# Patient Record
Sex: Female | Born: 1950 | Race: White | Hispanic: No | Marital: Married | Smoking: Former smoker
Health system: Southern US, Community
[De-identification: ages and names within clinical notes are randomized; demographics above are authoritative.]

## PROBLEM LIST (undated history)

## (undated) DIAGNOSIS — H359 Unspecified retinal disorder: Secondary | ICD-10-CM

## (undated) DIAGNOSIS — C4491 Basal cell carcinoma of skin, unspecified: Secondary | ICD-10-CM

## (undated) DIAGNOSIS — D229 Melanocytic nevi, unspecified: Secondary | ICD-10-CM

## (undated) HISTORY — DX: Melanocytic nevi, unspecified: D22.9

## (undated) HISTORY — DX: Basal cell carcinoma of skin, unspecified: C44.91

## (undated) HISTORY — PX: BACK SURGERY: SHX140

## (undated) HISTORY — DX: Unspecified retinal disorder: H35.9

## (undated) HISTORY — PX: KNEE SURGERY: SHX244

---

## 1977-03-28 HISTORY — PX: ABDOMINAL HYSTERECTOMY: SHX81

## 2001-03-28 HISTORY — PX: GALLBLADDER SURGERY: SHX652

## 2001-05-10 ENCOUNTER — Encounter (INDEPENDENT_AMBULATORY_CARE_PROVIDER_SITE_OTHER): Payer: Self-pay | Admitting: Specialist

## 2001-05-10 ENCOUNTER — Encounter: Payer: Self-pay | Admitting: Emergency Medicine

## 2001-05-10 ENCOUNTER — Inpatient Hospital Stay (HOSPITAL_COMMUNITY): Admission: EM | Admit: 2001-05-10 | Discharge: 2001-05-13 | Payer: Self-pay | Admitting: Emergency Medicine

## 2001-05-12 ENCOUNTER — Encounter: Payer: Self-pay | Admitting: Surgery

## 2001-10-12 ENCOUNTER — Ambulatory Visit (HOSPITAL_COMMUNITY): Admission: RE | Admit: 2001-10-12 | Discharge: 2001-10-12 | Payer: Self-pay | Admitting: Gastroenterology

## 2001-10-15 ENCOUNTER — Ambulatory Visit (HOSPITAL_COMMUNITY): Admission: RE | Admit: 2001-10-15 | Discharge: 2001-10-15 | Payer: Self-pay | Admitting: Gastroenterology

## 2001-10-15 ENCOUNTER — Encounter (INDEPENDENT_AMBULATORY_CARE_PROVIDER_SITE_OTHER): Payer: Self-pay | Admitting: *Deleted

## 2008-05-07 ENCOUNTER — Emergency Department (HOSPITAL_BASED_OUTPATIENT_CLINIC_OR_DEPARTMENT_OTHER): Admission: EM | Admit: 2008-05-07 | Discharge: 2008-05-07 | Payer: Self-pay | Admitting: Emergency Medicine

## 2008-11-14 ENCOUNTER — Ambulatory Visit (HOSPITAL_COMMUNITY): Admission: RE | Admit: 2008-11-14 | Discharge: 2008-11-14 | Payer: Self-pay | Admitting: Neurosurgery

## 2009-01-12 ENCOUNTER — Other Ambulatory Visit: Admission: RE | Admit: 2009-01-12 | Discharge: 2009-01-12 | Payer: Self-pay | Admitting: Family Medicine

## 2009-01-20 ENCOUNTER — Encounter: Admission: RE | Admit: 2009-01-20 | Discharge: 2009-01-20 | Payer: Self-pay | Admitting: Neurosurgery

## 2009-02-01 ENCOUNTER — Encounter: Admission: RE | Admit: 2009-02-01 | Discharge: 2009-02-01 | Payer: Self-pay | Admitting: Neurosurgery

## 2009-02-13 DIAGNOSIS — D229 Melanocytic nevi, unspecified: Secondary | ICD-10-CM

## 2009-02-13 HISTORY — DX: Melanocytic nevi, unspecified: D22.9

## 2009-03-24 ENCOUNTER — Encounter: Admission: RE | Admit: 2009-03-24 | Discharge: 2009-03-26 | Payer: Self-pay | Admitting: Orthopaedic Surgery

## 2010-03-28 HISTORY — PX: CATARACT EXTRACTION: SUR2

## 2010-07-03 LAB — CBC
HCT: 43 % (ref 36.0–46.0)
Hemoglobin: 14.7 g/dL (ref 12.0–15.0)
MCHC: 34.3 g/dL (ref 30.0–36.0)
MCV: 95.3 fL (ref 78.0–100.0)
Platelets: 255 10*3/uL (ref 150–400)
RBC: 4.51 MIL/uL (ref 3.87–5.11)
RDW: 13.7 % (ref 11.5–15.5)
WBC: 9.5 10*3/uL (ref 4.0–10.5)

## 2010-08-10 NOTE — Op Note (Signed)
NAMESALEEMAH, [name] NO.:  [id number]   MEDICAL RECORD NO.:  [id number]          PATIENT TYPE:  OIB   LOCATION:  3599                         FACILITY:  [facility]   PHYSICIAN:  Donalee Citrin, M.D.        DATE OF BIRTH:  1951/01/12   DATE OF PROCEDURE:  11/14/2008  DATE OF DISCHARGE:                               OPERATIVE REPORT   PREOPERATIVE DIAGNOSES:  Left L5 radiculopathy from lumbar spondylosis  with stenosis from ruptured disk, L4-5 left.   PROCEDURE:  Lumbar laminectomy microdiskectomy, L4-5 left with  microscopic dissection of the left L5 nerve root microdiskectomy.   SURGEON:  Donalee Citrin, M.D.   ASSISTANT:  Tia Alert, MD.   ANESTHESIA:  General endotracheal.   HISTORY OF PRESENT ILLNESS:  The patient is a very pleasant 60 year old  female who has had progressed worsening __________left hip and leg pain  radiating down to her calf, top of foot and big toe consistent with L5  nerve root pattern.  The patient failed all forms of conservative  treatment.  MRI scan showed large ruptured disk at L4-5 left as well as  spondylosis causing severe stenosis of the left L5 nerve root.  The  risks and benefits of the operation were explained to the patient, she  understands and agreed to proceed forward.   DESCRIPTION OF PROCEDURE:  The patient was brought to the operating  room, induced under general anesthesia, positioned prone on Wilson  frame.  The back was prepped in the usual sterile fashion.  Preop X-ray  localized the appropriate level.  After infiltrating 10 mL lidocaine  with epinephrine, midline incision made.  Bovie electrocautery was used  to take down __________ subperiosteal dissection carried out to lamina  of L4 and L5 on the left.  Intraoperative x-ray confirmed localization  at appropriate level so using a high-speed drill the inferior aspect of  L5, medial facet complex superior aspect of S1 was drilled down.  Then  using 2 and 3 mm Kerrison  punch inferior laminotomy was completed as  well as medial facetectomy and superior laminotomy of L5.  Ligamentum  flavum removed in piecemeal fashion exposing thecal sac and proximal L5  nerve root.  The L5 neural foramen was unroofed.  Then under microscope  illumination the L5 nerve root was dissected off of a large disk  herniation that was inferior to the disk space displacing up against the  pedicle, displacing the L5 nerve root medially.  This was teased away  with a nerve hook after annulotomy was made with the 11 blade scalpel to  open up the disk space and the disk space was radically cleaned out.  Several more fragments were removed from the medial border of the  pedicle.  After all this disk was removed, the L5 nerve root was  significantly decompressed.  The foramen was explored with an angled  hockey stick and coronary dilator and noted to be widely patent.  Wound  was then copiously irrigated.  Hemostasis was maintained.  Gelfoam was  laid overtop the dura. Muscle and fascia  reapproximated in layers with interrupted Vicryl and skin was closed  with running 4-0 subcuticular.  Benzoin and Steri-Strips applied.  The  patient went to recovery room in stable condition.  At the end of the  case needle and sponge count were correct.           ______________________________  Donalee Citrin, M.D.     GC/MEDQ  D:  11/14/2008  T:  11/14/2008  Job:  [id number]

## 2010-08-13 NOTE — Consult Note (Signed)
[facility]  Patient:    Melinda [name], KETRON Visit Number: [id number] MRN: [id number]          Service Type: MED Location: 3W 0345 02 Attending Physician:  Cathren Laine Dictated by:   Velora Heckler, M.D. Proc. Date: 05/10/01 Admit Date:  05/10/2001   CC:         Anselmo Rod, M.D.   Consultation Report  REASON FOR CONSULTATION:  Biliary pancreatitis, cholelithiasis, chronic cholecystitis.  ADMITTING PHYSICIAN:  Anselmo Rod, M.D.  BRIEF HISTORY:  The patient is a 60 year old white female admitted to Dr. Trilby Drummer service from the emergency department with 10-hour history of upper abdominal pain. The patient was in her normal state of good health at the grocery store with her daughter when she had a sudden onset of abdominal pain. The patient took Tagamet without symptomatic relief. This persisted until approximately 4:00 a.m. on the morning of admission, when the patient requested that her family take her to the emergency department for evaluation. The patient has had intermittent symptoms lasting from 1-2 hours after meals for approximately 6 months. She denies any history of fever. She denies any history of jaundice or acholic stools. The patient has had no prior hepatobiliary disease. The patient was taken to the emergency department at [facility] where she was evaluated. She was noted to have elevated liver function tests and elevated serum amylase levels. Ultrasound of the abdomen was obtained which showed multiple gallstones, increased wall thickness of the gallbladder, and no evidence of pericholecystic fluid, findings consistent with chronic cholecystitis. Both gastroenterology and general surgery are consulted at this time.  PAST MEDICAL HISTORY:  Status post abdominal hysterectomy, status post bilateral salpingo-oophorectomy, status post ganglion cyst of the foot, status post pilonidal cyst excision by Dr. Francina Ames,  60 years ago.  MEDICATIONS:  None.  ALLERGIES:  No known drug allergies.  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 She smokes 1/2 pack of cigarettes a day. She drinks alcohol on occasion. She is married.  FAMILY HISTORY:  No previous adverse events with anesthesia or surgical procedures. History of coronary artery disease in the patients father, history of diabetes.  REVIEW OF SYSTEMS:  A 15-system review without significant other positives except as noted above.  PHYSICAL EXAMINATION:  GENERAL:  This is a 60 year old well-developed, well-nourished white female on 3rd floor ward, [facility].  VITAL SIGNS:  Temperature 97.4, pulse 87, respirations 18, blood pressure 136/85.  HEENT:  Normocephalic, atraumatic. Sclerae are clear. Dentition is good.  NECK:  Supple without mass. Thyroid is normal without nodularity.  LUNGS:  Clear to auscultation without rales or rhonchi. There is no costovertebral angle tenderness.  CARDIAC:  Shows regular rate and rhythm without murmur.  ABDOMEN:  Soft. There are bowel sounds present. There is tenderness in the epigastrium and particularly in the right upper quadrant to deep palpation. There are no palpable masses. There is no guarding. There is no rebound tenderness. There is a well-healed Pfannenstiel incision and a well-healed umbilical incision consistent with previous gynecologic surgery.  EXTREMITIES:  Nontender without edema.  NEUROLOGIC:  The patient is alert and oriented to person, place, and time without focal neurologic deficit.  LABORATORY AND ACCESSORY DATA:  Dated May 10, 2001:  White count 12.2, hemoglobin 15.0, hematocrit 42.3%, platelet count 261,000, differential shows 84% neutrophils, 10% lymphocytes, 6% monocytes. Chemistry profile shows the following abnormalities:  Glucose 113, SGOT 630, SGPT 418. Alkaline phosphatase  is normal at 95, total bilirubin is normal at  1.1, serum amylase is elevated at 1,428.  Ultrasound revealed abdominal ultrasound dated May 10, 2001, from [facility] shows findings of multiple gallstones. The gallbladder wall was thickened. There was no pericholecystic fluid or wall edema. Common bile duct was normal at 3.2 mm. Final diagnosis:  Cholelithiasis with likely chronic cholecystitis.  EKG shows normal sinus rhythm, no acute findings.  IMPRESSION: 1. Biliary pancreatitis. 2. Cholelithiasis. 3. Chronic cholecystitis.  PLAN: 1. Agree with admission and nothing per mouth status, intravenous    hydration. 2. Repeat laboratory studies in the morning, including CBC, complete    metabolic profile, and amylase level. 3. If no significant improvement, consider endoscopic retrograde    cholangiopancreatography with stone extraction. 4. The patient will need cholecystectomy during this admission. Dictated by:   Velora Heckler, M.D. Attending Physician:  Cathren Laine DD:  05/10/01 TD:  05/10/01 Job: [id number]

## 2010-08-13 NOTE — Discharge Summary (Signed)
[facility]  Patient:    AMARII, AMY Visit Number: [id number] MRN: [id number]          Service Type: MED Location: 3W 0345 02 Attending Physician:  Bonnetta Barry Dictated by:   Angelia Mould. Derrell Lolling, M.D. Admit Date:  05/10/2001 Discharge Date: 05/13/2001   CC:         Anselmo Rod, M.D.   Discharge Summary  FINAL DIAGNOSES: 1. Acute biliary pancreatitis. 2. Chronic cholecystitis with cholelithiasis.  OPERATIONS PERFORMED:  Laparoscopic cholecystectomy with intraoperative cholangiogram May 12, 2001.  HISTORY OF PRESENT ILLNESS:  This is a 60 year old white female who presented with a 12-hour history of right upper quadrant abdominal pain and nausea and one episode of emesis.  She has had similar bouts of pain although less severe for about six months, usually after eating.  She denies fever, chills.  For details of her past medical history, family history, and social history, please see detailed admission note.  PHYSICAL EXAMINATION:  GENERAL:  A healthy-appearing middle-aged white female in mild distress from pain.  HEENT:  Sclerae clear.  NECK:  Without nodes.  LUNGS:  Clear.  HEART:  Regular rate and rhythm.  No murmur.  ABDOMEN:  Soft but with right upper quadrant tenderness.  No rebound. Positive Murphys sign.  No mass.  ADMISSION DATA:  White blood cell count 12,200, hemoglobin 15, total bilirubin 1.1, alkaline phosphatase 418, AST 630, amylase 1428, lipase less than 5.  HOSPITAL COURSE:  The patient was admitted by Dr. Danelle Earthly and treated for acute pancreatitis with analgesics, IV hydration, and bowel rest.  The patient was seen on May 10, 2001, by Dr. Darnell Level of the [facility] practice.  He felt that she was having biliary pancreatitis and recommended initial nonoperative treatment for a day or two.  Over the next 48 hours, the patients pain got much better, white blood  cell count came down to 6600, and liver function tests improved.  We discussed options for management and decided against ERCP feeling that that would be a low-yield procedure.  Surgery was recommended.  Surgical management was transferred to me and that was agreeable to the patient.  The patient was taken to the operating room on May 12, 2001, and underwent laparoscopic cholecystectomy with intraoperative cholangiogram.  The surgery went well. The following morning, she looked much better.  Her liver function tests although not normal were almost completely normal.  She was able to tolerate a diet, ambulate independently, and was ready to go home.  She was discharged on May 13, 2001.  She was given a prescription for Vicodin for pain.  She was asked to return to see me in the office in three weeks. Dictated by:   Angelia Mould. Derrell Lolling, M.D. Attending Physician:  Bonnetta Barry DD:  06/04/01 TD:  06/06/01 Job: [id number]

## 2010-08-13 NOTE — Op Note (Signed)
[facility]  Patient:    Melinda [name], Melinda [name] Visit Number: [id number] MRN: [id number]          Service Type: MED Location: 3W 0345 02 Attending Physician:  Cathren Laine Dictated by:   Angelia Mould. Derrell Lolling, M.D. Proc. Date: 05/12/01 Admit Date:  05/10/2001   CC:         Anselmo Rod, M.D.   Operative Report  PREOPERATIVE DIAGNOSIS:  Gallstone pancreatitis.  POSTOPERATIVE DIAGNOSIS:  Gallstone pancreatitis.  OPERATION PERFORMED:  Laparoscopic cholecystectomy with intraoperative cholangiogram.  SURGEON:  Angelia Mould. Derrell Lolling, M.D.  FIRST ASSISTANT:  Anselm Pancoast. Zachery Dakins, M.D.  INDICATIONS FOR PROCEDURE:  This is a 60 year old white female previously well. She was admitted on May 10, 2001 with a 12 hour history of right upper quadrant pain and nausea. She was found to have some abdominal tenderness in the right upper quadrant. Lab work showed a serum amylase of 1428, alkaline phosphatase 95, AST 630, ALT 418, bilirubin 1.1. Ultrasound showed multiple gallstones, thickened gallbladder wall, and a normal common bile duct. Clinically, her pancreatitis has resolved with normalization of her amylase, resolution of her pain and improvement but not complete normalization of her liver function tests. She is brought to the operating room semi-electively.  OPERATIVE FINDINGS:  The gallbladder was chronically inflamed and thick walled. There was no ascites, no fat necrosis, no obvious purulence. The cystic duct was tiny in caliber. The cholangiogram showed normal intrahepatic and extrahepatic bile ducts, no filling defect, but a very slow emptying into the duodenum consistent with pancreatic edema. Small bowel and large bowel, liver, stomach, and peritoneal surfaces were all otherwise normal to inspection.  OPERATIVE TECHNIQUE:  Following the induction of general anesthesia, the patients abdomen was prepped and draped in a sterile fashion. The 0.5% Marcaine  with epinephrine was used a local infiltration anesthetic. A vertically oriented incision was made inside the lower rim of the umbilicus. The fascia was incised in the midline and the abdominal cavity entered under direct vision. The 10 mm Hasson trocar was inserted and secured with a pursestring suture of #0 Vicryl. A pneumoperitoneum was created. The video camera was inserted with visualization and findings as described above. A 10 mm trocar was placed in the subxiphoid region and two 5 mm trocars placed in the right mid abdomen. The gallbladder was elevated. Adhesions were taken down. We dissected the peritoneum off of the cystic duct and cystic artery. We isolated the cystic artery, anterior branches around the gallbladder, secured it with metal clips and divided it. We then later found the posterior branch and it went onto the posterior wall of the gallbladder, isolated it with metal clips and divided it. We isolated a very large length of the cystic duct and created a nice window behind it. A metal clip was placed on the cystic duct close to the gallbladder. The cholangiogram catheter was inserted into the cystic duct and the cholangiogram was obtained using the C-arm.  This showed normal intrahepatic and extrahepatic bile ducts, no filling defects, but very slow drainage into the duodenum. We gave a milligram of Glucagon and waited about 5 minutes and the drainage was still kind of slow but with magnification views, we could see that there was no filling defect just a little bit of tinkering consistent with some pancreatic edema. We had this reviewed by a radiologist and he agreed with this interpretation. We felt that nothing further needed to be done. The cholangiogram catheter was removed. The cystic duct was  secured with metal clips and divided. The gallbladder was dissected from its bed with electrocautery and removed through the umbilical port. The operative field was copiously  irrigated. At the completion of the case, there was no bleeding and no bile leak whatsoever. The trocars were removed under direct vision and there was no bleeding from the trocar sites. The pneumoperitoneum was released. The fascia at the umbilicus was closed with #0 Vicryl sutures. The skin incisions were closed with subcuticular sutures of 4-0 Vicryl and Steri-Strips. Clean bandages were placed. The patient taken to the recovery room in stable condition. Estimated blood loss was about 10 cc. Complications none. Sponge, needle and instrument counts were correct. Dictated by:   Angelia Mould. Derrell Lolling, M.D. Attending Physician:  Cathren Laine DD:  05/12/01 TD:  05/12/01 Job: [id number]

## 2010-08-13 NOTE — Procedures (Signed)
[place]  Patient:    Melinda [name], Melinda [name] Visit Number: [id number] MRN: [id number]          Service Type: END Location: ENDO Attending Physician:  Charna Elizabeth Dictated by:   Anselmo Rod, M.D. Proc. Date: 10/15/01 Admit Date:  10/15/2001 Discharge Date: 10/15/2001   CC:         Marinus Maw, M.D.   Procedure Report  DATE OF BIRTH:  07-Aug-1950  PROCEDURE PERFORMED:  Colonoscopy with snare polypectomy x1.  ENDOSCOPIST:  Anselmo Rod, M.D.  INSTRUMENT USED:  Olympus video colonoscope.  INDICATIONS FOR PROCEDURE:  A 60 year old white female undergoing screening colonoscopy.  The patient has a history of abdominal pain and a personal history of cervical cancer.  She has a longstanding history of constipation.  PREPROCEDURE PREPARATION:  Informed consent was procured from the patient. The patient was fasted for eight hours prior to the procedure, and prepped with two bottles of Fleets Phospho-Soda the night prior to the procedure, and maintained on clear liquids for 48 hours prior to the procedure.  The patient was prepped and brought to the endoscopy unit for a colonoscopy on October 12, 2001, but as there was a large amount of residual stool in the colon, she was sent home with plans for repeat prep and repeat colonoscopy.  PREPROCEDURE PHYSICAL:  VITAL SIGNS:  Stable.  NECK:  Supple.  CHEST:  Clear to auscultation, S1 and S2 regular.  ABDOMEN:  Soft with normal bowel sounds.  DESCRIPTION OF PROCEDURE:  The patient was placed in the left lateral decubitus position and sedated with 50 mg of Demerol and 4 mg of Versed intravenously.  Once the patient was adequately sedated, maintained on low flow oxygen and continuous cardiac monitoring, the Olympus video colonoscope was advanced from the rectum to the cecum with extreme difficulty.  The patients position was changed from the left lateral to the supine right lateral  position.  She was then changed to a prone position to facilitate the entry of the scope into the cecum.  There was a large amount of residual stool in the right colon.  Multiple washings were done.  The appendicular orifice and the ileocecal valve were clearly visualized and photographed.  A flat polyp was snared from the rectum.  The colon seemed very atonic.  IMPRESSION: 1. Small flat polyp snared from the rectum. 2. Atonic colon. 3. No large masses or polyps seen besides the one mentioned above.  RECOMMENDATIONS: 1. Await pathology results. 2. Avoid all nonsteroidals for the next three weeks. 3. Outpatient follow up in the next two weeks. Dictated by:   Anselmo Rod, M.D. Attending Physician:  Charna Elizabeth DD:  10/15/01 TD:  10/18/01 Job: [id number]

## 2010-08-13 NOTE — H&P (Signed)
[facility]  Patient:    Melinda [name], Melinda [name] Visit Number: [id number] MRN: [id number]          Service Type: MED Location: 3W 0345 02 Attending Physician:  Cathren Laine Dictated by:   Myles Rosenthal, M.D. Admit Date:  05/10/2001   CC:         Velora Heckler, M.D.   History and Physical  CHIEF COMPLAINT:  Abdominal pain and nausea.  HISTORY OF PRESENT ILLNESS:  This is a 60 year old white female without significant past medical history, who presents with 12 hours of severe right upper quadrant abdominal pain and nausea.  She had onset of pain at approximately 8:30 p.m. last evening while shopping.  The pain was sharp, located in the epigastrium, and radiated to the right upper quadrant.  She did have nausea associated with this, as well as one episode of brown emesis later in the evening.  She denies any coffee grounds or frank blood in her emesis. The pain was unrelieved with antacids.  She does report a history of previous episodes of similar pain, approximately one to two per month, over the past six months which resolved within one to two hours.  She denies any diarrhea, constipation, melena, or hematochezia.  She denies any changes in her bowel habits.  No changes in stool caliber.  No odynophagia or dysphagia.  She denies any fever, chills, shortness of breath, chest pain, weakness, dizziness, dysuria, polyuria, rash, or extremity swelling.  PAST MEDICAL HISTORY:  No chronic medical problems noted.  PAST SURGICAL HISTORY: 1. Status post hysterectomy in 1979 secondary to cervical cancer in situ, as    well as fibroids. 2. Status post bilateral oophorectomy in 1982. 3. Status post right foot surgery for ganglion cyst removal in 1981. 4. Status post remote pilonidal cyst removal.  ALLERGIES:  NKDA.  MEDICINES:  None.  FAMILY HISTORY:  Mother deceased with CVA and coronary artery disease with an MI at the age of 56.  Father living with metastatic  prostate cancer and type 2 diabetes, diet controlled.  She does have two brothers, both of which are deceased with cancer, one with melanoma and the other with lymphoma.  There is no history of colon cancer or any bowel disease.  SOCIAL HISTORY:  She is married.  She does have three grown children, none of which live with her.  She works as a Production designer, theatre/television/film for a [employer], environmental.  She smokes half of a pack of cigarettes daily.  She does occasionally drink alcohol, approximately two to three drinks per month.  She does deny any illicit drug use.  No history of intravenous drug use.  PHYSICAL EXAMINATION:  VITAL SIGNS:  Temperature 97.4 degrees, pulse 87, respirations 18, blood pressure 136/85.  GENERAL APPEARANCE:  This is a well-developed, well-nourished, white female who looks her stated age.  Sitting up in bed in no apparent distress.  HEENT:  Normocephalic and atraumatic.  Pupils equal, round, and reactive to light.  Extraocular muscles are intact.  The oropharynx is clear with moist mucous membranes.  NECK:  There is no lymphadenopathy or thyromegaly.  CHEST:  Clear to auscultation bilaterally without any wheezes.  She does have good air movement throughout.  CARDIOVASCULAR:  Regular rate and rhythm without murmurs, rubs, or gallops. She has 2+ pulses in all four extremities.  ABDOMEN:  Notable for positive right upper quadrant tenderness without rebound or guarding.  She does have a positive Murphys sign.  There is no mass or hepatosplenomegaly  noted.  She does have normoactive bowel sounds.  EXTREMITIES:  No edema or rash.  NEUROLOGIC:  She alert and oriented x 4.  Cranial nerves are intact and equal bilaterally.  Sensation and strength are grossly within normal limits in all four extremities.  LABORATORY DATA:  Sodium 139, potassium 4.5, chloride 105, CO2 28, BUN 11, creatinine 0.8, glucose 113, AST 630, ALT 418, alkaline phosphatase 95, bilirubin 1.1.  The protein is 7.2 and  albumin 3.7.  The amylase is 1428 and the lipase is less than 5.  The white count is 12.2 with 84% polys, hemoglobin 15.0, and platelets 261.  The EKG shows normal sinus rhythm with a sinus arrhythmia.  There were no ischemic changes noted.  The abdominal ultrasound reveals multiple gallstones and a thickened gallbladder wall.  The common bile duct appears normal with a diameter of 3.2 mm.  ASSESSMENT AND PLAN:  Cholecystitis.  Will admit the patient and hydrate with intravenous fluids while controlling her symptoms with IV Demerol and Phenergan as needed.  We will keep her NPO for now until surgery has evaluated her for a possible cholecystectomy.  Velora Heckler, M.D., is aware of the patient and will see later today.  As the patients lipase is low normal, I doubt that she does have gallstone pancreatitis.  I suspect that her increased amylase, as well as her elevated LFTs are likely secondary to her gallbladder disease.  Will check PT and PTT as part of preoperative evaluation.  Will plan repeat LFTs and CBC tomorrow morning. Dictated by:   Myles Rosenthal, M.D. Attending Physician:  Cathren Laine DD:  05/10/01 TD:  05/10/01 Job: [id number]

## 2010-08-13 NOTE — Procedures (Signed)
[facility]  Patient:    Melinda [name], Melinda [name] Visit Number: [id number] MRN: [id number]          Service Type: END Location: ENDO Attending Physician:  Charna Elizabeth Dictated by:   Anselmo Rod, M.D. Proc. Date: 10/12/01 Admit Date:  10/15/2001 Discharge Date: 10/15/2001   CC:         Marinus Maw, M.D.   Procedure Report  DATE OF BIRTH:  1951-03-17  PROCEDURE PERFORMED:  Colonoscopy changed to a flexible sigmoidoscopy.  ENDOSCOPIST:  Anselmo Rod, M.D.  INSTRUMENT USED:  Olympus video colonoscope.  INDICATIONS FOR PROCEDURE:  A 60 year old white female undergoing screening colonoscopy.  The patient has a history of breast cancer in a cousin who died of it and two brothers who died of metastatic melanoma.  The patient has a personal history of cervical cancer.  The patient denies hematochezia or melena.  DESCRIPTION OF PROCEDURE:  The patient was placed in the left lateral decubitus position and sedated with 50 mg of Demerol and 6 mg of Versed intravenously.  Once the patient was adequately sedated, maintained on low flow oxygen and continuous cardiac monitoring, the Olympus video colonoscope was advanced from the rectum to about 40 cm with extreme difficulty.  There was a large amount of residual stool in the colon.  The procedure had to be aborted at this point with plan to reprep the patient and rescope the patient next week. Dictated by:   Anselmo Rod, M.D. Attending Physician:  Charna Elizabeth DD:  10/12/01 TD:  10/17/01 Job: [id number]

## 2010-08-24 ENCOUNTER — Other Ambulatory Visit: Payer: Self-pay

## 2010-08-24 DIAGNOSIS — C4491 Basal cell carcinoma of skin, unspecified: Secondary | ICD-10-CM

## 2010-08-24 HISTORY — DX: Basal cell carcinoma of skin, unspecified: C44.91

## 2010-12-13 ENCOUNTER — Other Ambulatory Visit: Payer: Self-pay | Admitting: Dermatology

## 2010-12-13 DIAGNOSIS — C4491 Basal cell carcinoma of skin, unspecified: Secondary | ICD-10-CM

## 2010-12-13 HISTORY — DX: Basal cell carcinoma of skin, unspecified: C44.91

## 2011-09-13 ENCOUNTER — Other Ambulatory Visit: Payer: Self-pay | Admitting: Dermatology

## 2011-11-10 ENCOUNTER — Other Ambulatory Visit: Payer: Self-pay | Admitting: Dermatology

## 2012-09-13 ENCOUNTER — Other Ambulatory Visit: Payer: Self-pay | Admitting: Optometry

## 2012-09-13 DIAGNOSIS — H547 Unspecified visual loss: Secondary | ICD-10-CM

## 2012-09-20 ENCOUNTER — Ambulatory Visit
Admission: RE | Admit: 2012-09-20 | Discharge: 2012-09-20 | Disposition: A | Discharge status: Home / Self Care | Payer: 59 | Source: Ambulatory Visit | Attending: Optometry | Admitting: Optometry

## 2012-09-20 DIAGNOSIS — H547 Unspecified visual loss: Secondary | ICD-10-CM

## 2012-09-20 MED ORDER — GADOBENATE DIMEGLUMINE 529 MG/ML IV SOLN
18.0000 mL | Freq: Once | INTRAVENOUS | Status: AC | PRN
  Administered 2012-09-20: 18 mL via INTRAVENOUS

## 2014-08-21 ENCOUNTER — Other Ambulatory Visit: Payer: Self-pay | Admitting: Physician Assistant

## 2014-12-24 ENCOUNTER — Other Ambulatory Visit (HOSPITAL_COMMUNITY): Payer: Self-pay | Admitting: Orthopaedic Surgery

## 2014-12-24 DIAGNOSIS — M79671 Pain in right foot: Secondary | ICD-10-CM

## 2015-01-01 ENCOUNTER — Ambulatory Visit (HOSPITAL_COMMUNITY): Admission: RE | Admit: 2015-01-01 | Payer: 59 | Source: Ambulatory Visit

## 2015-01-01 ENCOUNTER — Ambulatory Visit (HOSPITAL_COMMUNITY)
Admission: RE | Admit: 2015-01-01 | Discharge: 2015-01-01 | Disposition: A | Discharge status: Home / Self Care | Payer: 59 | Source: Ambulatory Visit | Attending: Orthopaedic Surgery | Admitting: Orthopaedic Surgery

## 2015-01-01 DIAGNOSIS — M2011 Hallux valgus (acquired), right foot: Secondary | ICD-10-CM | POA: Diagnosis not present

## 2015-01-01 DIAGNOSIS — M79671 Pain in right foot: Secondary | ICD-10-CM

## 2015-09-15 ENCOUNTER — Other Ambulatory Visit: Payer: Self-pay | Admitting: Orthopaedic Surgery

## 2015-09-15 DIAGNOSIS — M25472 Effusion, left ankle: Secondary | ICD-10-CM

## 2015-09-16 ENCOUNTER — Ambulatory Visit
Admission: RE | Admit: 2015-09-16 | Discharge: 2015-09-16 | Disposition: A | Discharge status: Home / Self Care | Payer: 59 | Source: Ambulatory Visit | Attending: Orthopaedic Surgery | Admitting: Orthopaedic Surgery

## 2015-09-16 DIAGNOSIS — M25472 Effusion, left ankle: Secondary | ICD-10-CM

## 2015-10-27 DIAGNOSIS — R928 Other abnormal and inconclusive findings on diagnostic imaging of breast: Secondary | ICD-10-CM | POA: Diagnosis not present

## 2015-10-27 DIAGNOSIS — E785 Hyperlipidemia, unspecified: Secondary | ICD-10-CM | POA: Diagnosis not present

## 2015-10-27 DIAGNOSIS — E559 Vitamin D deficiency, unspecified: Secondary | ICD-10-CM | POA: Diagnosis not present

## 2015-11-11 DIAGNOSIS — H35071 Retinal telangiectasis, right eye: Secondary | ICD-10-CM | POA: Diagnosis not present

## 2015-11-11 DIAGNOSIS — H469 Unspecified optic neuritis: Secondary | ICD-10-CM | POA: Diagnosis not present

## 2015-11-11 DIAGNOSIS — H35351 Cystoid macular degeneration, right eye: Secondary | ICD-10-CM | POA: Diagnosis not present

## 2015-11-11 DIAGNOSIS — H35341 Macular cyst, hole, or pseudohole, right eye: Secondary | ICD-10-CM | POA: Diagnosis not present

## 2015-11-11 DIAGNOSIS — H5359 Other color vision deficiencies: Secondary | ICD-10-CM | POA: Diagnosis not present

## 2015-11-11 DIAGNOSIS — H547 Unspecified visual loss: Secondary | ICD-10-CM | POA: Diagnosis not present

## 2016-01-21 DIAGNOSIS — Z0000 Encounter for general adult medical examination without abnormal findings: Secondary | ICD-10-CM | POA: Diagnosis not present

## 2016-01-21 DIAGNOSIS — E785 Hyperlipidemia, unspecified: Secondary | ICD-10-CM | POA: Diagnosis not present

## 2016-01-21 DIAGNOSIS — E559 Vitamin D deficiency, unspecified: Secondary | ICD-10-CM | POA: Diagnosis not present

## 2016-01-21 DIAGNOSIS — Z23 Encounter for immunization: Secondary | ICD-10-CM | POA: Diagnosis not present

## 2016-02-05 DIAGNOSIS — I8312 Varicose veins of left lower extremity with inflammation: Secondary | ICD-10-CM | POA: Diagnosis not present

## 2016-02-05 DIAGNOSIS — I83892 Varicose veins of left lower extremities with other complications: Secondary | ICD-10-CM | POA: Diagnosis not present

## 2016-02-09 DIAGNOSIS — L570 Actinic keratosis: Secondary | ICD-10-CM | POA: Diagnosis not present

## 2016-02-09 DIAGNOSIS — D239 Other benign neoplasm of skin, unspecified: Secondary | ICD-10-CM | POA: Diagnosis not present

## 2016-02-09 DIAGNOSIS — I83812 Varicose veins of left lower extremities with pain: Secondary | ICD-10-CM | POA: Diagnosis not present

## 2016-02-09 DIAGNOSIS — I8312 Varicose veins of left lower extremity with inflammation: Secondary | ICD-10-CM | POA: Diagnosis not present

## 2016-02-11 DIAGNOSIS — H3581 Retinal edema: Secondary | ICD-10-CM | POA: Diagnosis not present

## 2016-02-11 DIAGNOSIS — D8989 Other specified disorders involving the immune mechanism, not elsewhere classified: Secondary | ICD-10-CM | POA: Diagnosis not present

## 2016-02-11 DIAGNOSIS — H547 Unspecified visual loss: Secondary | ICD-10-CM | POA: Diagnosis not present

## 2016-02-11 DIAGNOSIS — H468 Other optic neuritis: Secondary | ICD-10-CM | POA: Diagnosis not present

## 2016-02-11 DIAGNOSIS — H469 Unspecified optic neuritis: Secondary | ICD-10-CM | POA: Diagnosis not present

## 2016-02-11 DIAGNOSIS — H5359 Other color vision deficiencies: Secondary | ICD-10-CM | POA: Diagnosis not present

## 2016-02-11 DIAGNOSIS — Z135 Encounter for screening for eye and ear disorders: Secondary | ICD-10-CM | POA: Diagnosis not present

## 2016-02-11 DIAGNOSIS — H3500 Unspecified background retinopathy: Secondary | ICD-10-CM | POA: Diagnosis not present

## 2016-02-11 DIAGNOSIS — H35353 Cystoid macular degeneration, bilateral: Secondary | ICD-10-CM | POA: Diagnosis not present

## 2016-03-02 DIAGNOSIS — I83812 Varicose veins of left lower extremities with pain: Secondary | ICD-10-CM | POA: Diagnosis not present

## 2016-03-02 DIAGNOSIS — I8312 Varicose veins of left lower extremity with inflammation: Secondary | ICD-10-CM | POA: Diagnosis not present

## 2016-04-06 DIAGNOSIS — Z01818 Encounter for other preprocedural examination: Secondary | ICD-10-CM | POA: Diagnosis not present

## 2016-04-06 DIAGNOSIS — Z1211 Encounter for screening for malignant neoplasm of colon: Secondary | ICD-10-CM | POA: Diagnosis not present

## 2016-04-07 DIAGNOSIS — I83892 Varicose veins of left lower extremities with other complications: Secondary | ICD-10-CM | POA: Diagnosis not present

## 2016-04-07 DIAGNOSIS — I8312 Varicose veins of left lower extremity with inflammation: Secondary | ICD-10-CM | POA: Diagnosis not present

## 2016-04-28 DIAGNOSIS — I8312 Varicose veins of left lower extremity with inflammation: Secondary | ICD-10-CM | POA: Diagnosis not present

## 2016-04-28 DIAGNOSIS — I83812 Varicose veins of left lower extremities with pain: Secondary | ICD-10-CM | POA: Diagnosis not present

## 2016-06-09 DIAGNOSIS — H547 Unspecified visual loss: Secondary | ICD-10-CM | POA: Diagnosis not present

## 2016-06-09 DIAGNOSIS — H5359 Other color vision deficiencies: Secondary | ICD-10-CM | POA: Diagnosis not present

## 2016-06-09 DIAGNOSIS — H3500 Unspecified background retinopathy: Secondary | ICD-10-CM | POA: Diagnosis not present

## 2016-06-09 DIAGNOSIS — D8989 Other specified disorders involving the immune mechanism, not elsewhere classified: Secondary | ICD-10-CM | POA: Diagnosis not present

## 2016-06-09 DIAGNOSIS — H469 Unspecified optic neuritis: Secondary | ICD-10-CM | POA: Diagnosis not present

## 2016-06-09 DIAGNOSIS — H35351 Cystoid macular degeneration, right eye: Secondary | ICD-10-CM | POA: Diagnosis not present

## 2016-06-09 DIAGNOSIS — Z135 Encounter for screening for eye and ear disorders: Secondary | ICD-10-CM | POA: Diagnosis not present

## 2016-08-08 DIAGNOSIS — M8588 Other specified disorders of bone density and structure, other site: Secondary | ICD-10-CM | POA: Diagnosis not present

## 2016-09-20 DIAGNOSIS — D8989 Other specified disorders involving the immune mechanism, not elsewhere classified: Secondary | ICD-10-CM | POA: Diagnosis not present

## 2016-09-20 DIAGNOSIS — H547 Unspecified visual loss: Secondary | ICD-10-CM | POA: Diagnosis not present

## 2016-09-20 DIAGNOSIS — H469 Unspecified optic neuritis: Secondary | ICD-10-CM | POA: Diagnosis not present

## 2016-09-20 DIAGNOSIS — H3500 Unspecified background retinopathy: Secondary | ICD-10-CM | POA: Diagnosis not present

## 2016-09-20 DIAGNOSIS — H35351 Cystoid macular degeneration, right eye: Secondary | ICD-10-CM | POA: Diagnosis not present

## 2016-09-21 ENCOUNTER — Other Ambulatory Visit: Payer: Self-pay | Admitting: Ophthalmology

## 2016-09-21 DIAGNOSIS — H3500 Unspecified background retinopathy: Secondary | ICD-10-CM

## 2016-09-21 DIAGNOSIS — D8989 Other specified disorders involving the immune mechanism, not elsewhere classified: Secondary | ICD-10-CM

## 2016-09-27 ENCOUNTER — Other Ambulatory Visit: Payer: 59

## 2016-09-29 ENCOUNTER — Ambulatory Visit
Admission: RE | Admit: 2016-09-29 | Discharge: 2016-09-29 | Disposition: A | Discharge status: Home / Self Care | Payer: 59 | Source: Ambulatory Visit | Attending: Ophthalmology | Admitting: Ophthalmology

## 2016-09-29 DIAGNOSIS — H3500 Unspecified background retinopathy: Secondary | ICD-10-CM

## 2016-09-29 DIAGNOSIS — R195 Other fecal abnormalities: Secondary | ICD-10-CM | POA: Diagnosis not present

## 2016-09-29 DIAGNOSIS — I700 Atherosclerosis of aorta: Secondary | ICD-10-CM | POA: Diagnosis not present

## 2016-09-29 DIAGNOSIS — D8989 Other specified disorders involving the immune mechanism, not elsewhere classified: Secondary | ICD-10-CM

## 2016-09-29 MED ORDER — IOPAMIDOL (ISOVUE-300) INJECTION 61%
100.0000 mL | Freq: Once | INTRAVENOUS | Status: AC | PRN
  Administered 2016-09-29: 100 mL via INTRAVENOUS

## 2016-11-05 IMAGING — MR MR FOOT*R* W/O CM
4 of 6 series · 19 of 40 positions shown · non-contrast
Comparison: None.

CLINICAL DATA: Pain at the first metatarsal phalangeal joint for 1
year. Osteoarthritis with bunion. Initial encounter.

EXAM:
MRI OF THE RIGHT FOREFOOT WITHOUT CONTRAST
TECHNIQUE: Multiplanar, multisequence MR imaging was performed. No intravenous
contrast was administered.

[Series 3: T1 · coronal · 4.0mm · 0.29mm/px · 9 of 32 slices shown (1 of 3)]
[im 1/32]
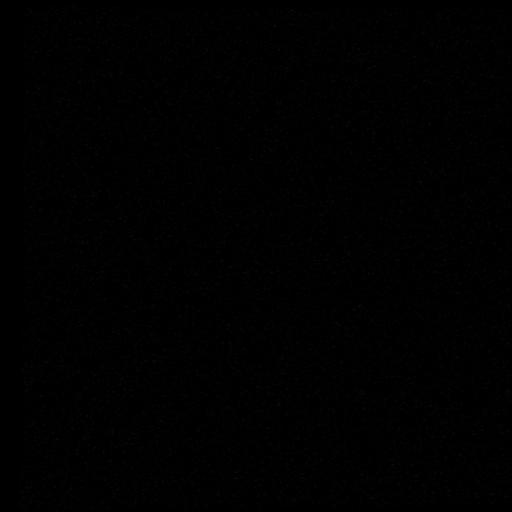
[im 4/32]
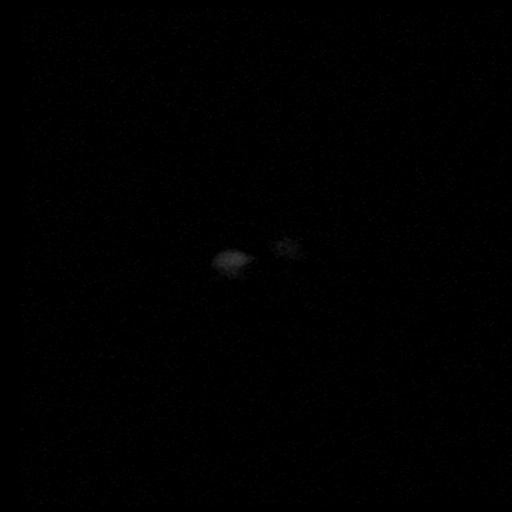
[im 8/32]
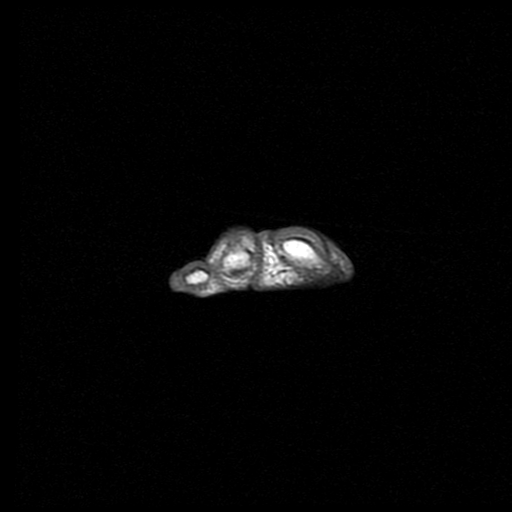
[im 12/32]
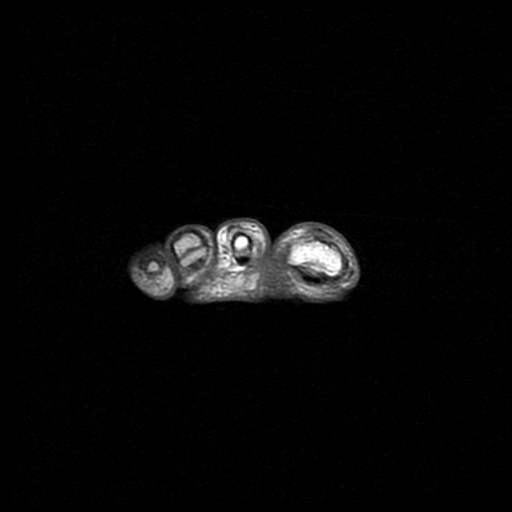
[im 16/32]
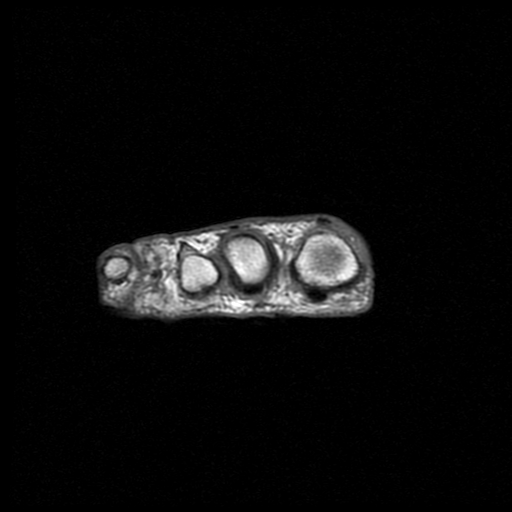
[im 20/32]
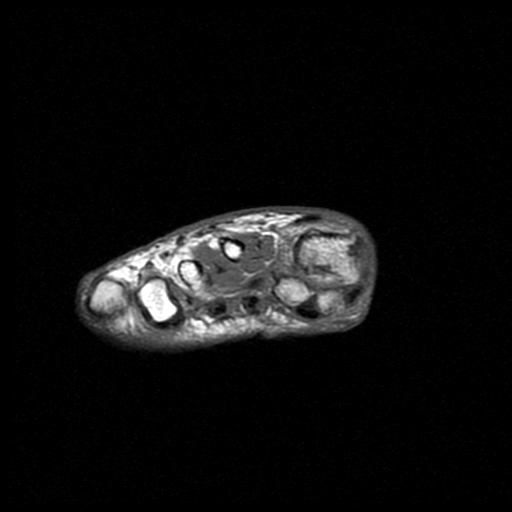
[im 24/32]
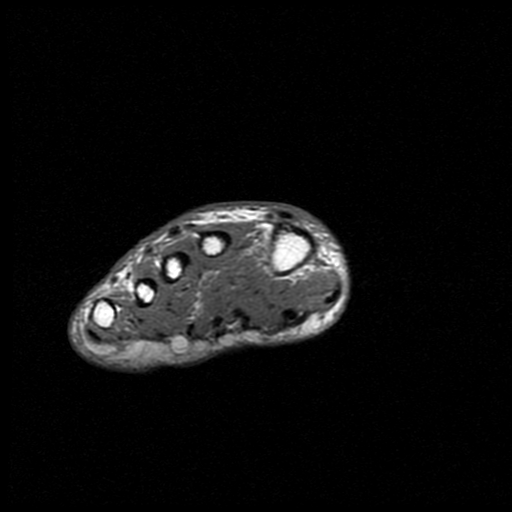
[im 28/32]
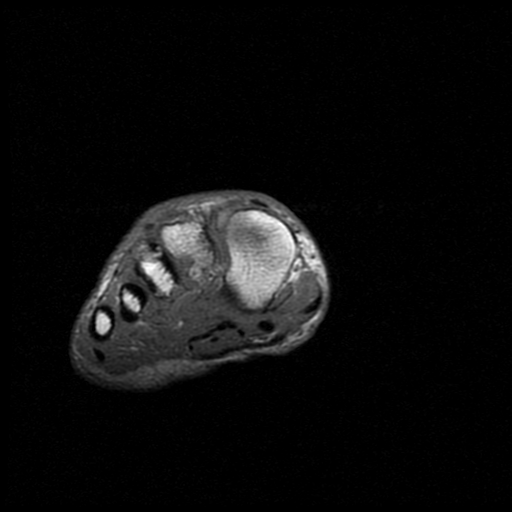
[im 32/32]
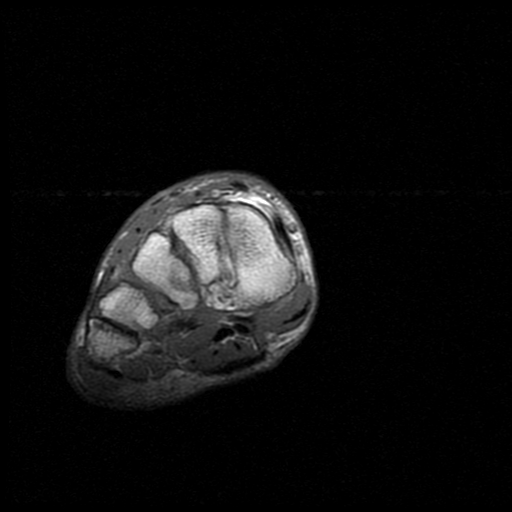

[Series 5: T2 · coronal · 4.0mm · 0.29mm/px · 3 of 32 slices shown]
[im 4/32]
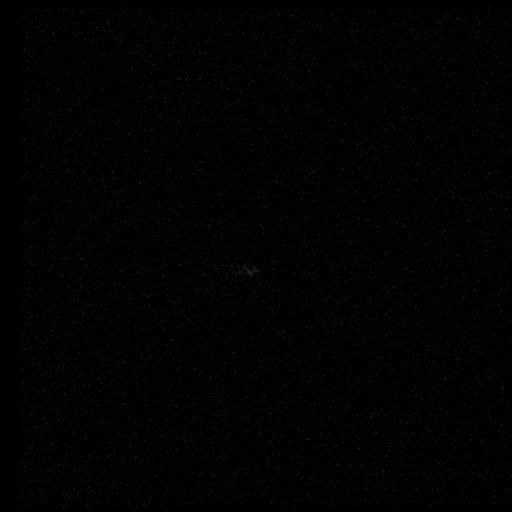
[im 16/32]
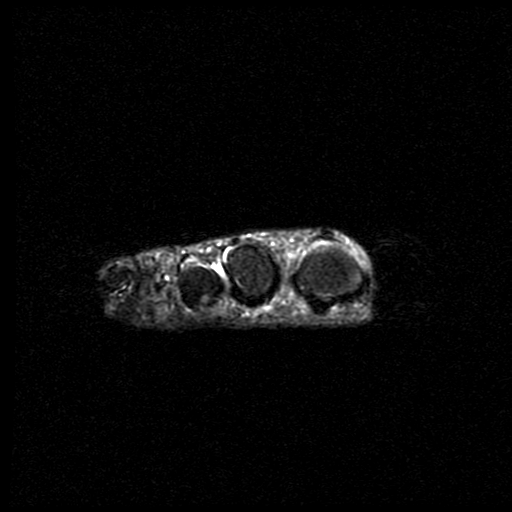
[im 28/32]
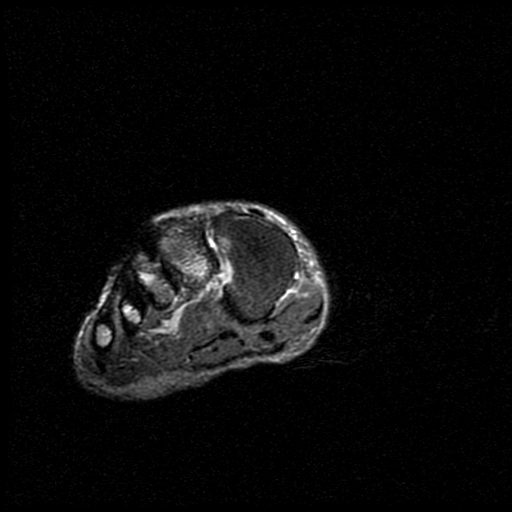

[Series 6: T1 · axial · 4.0mm · 0.29mm/px · z∈[-89,-4]mm · 4 of 18 slices shown (2 of 3)]
[im 1/18]
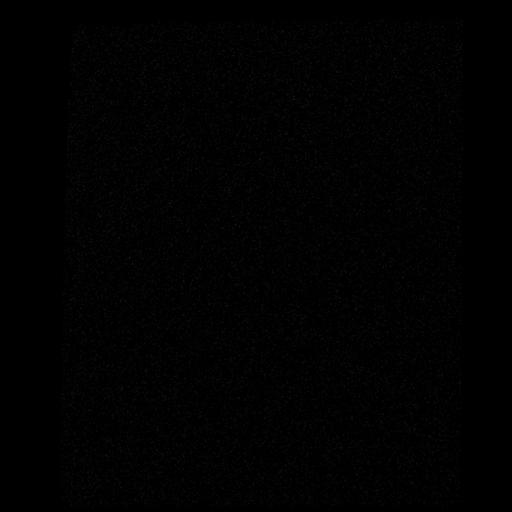
[im 5/18]
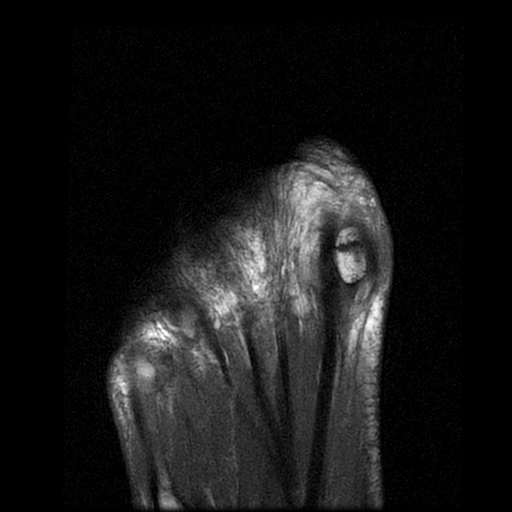
[im 9/18]
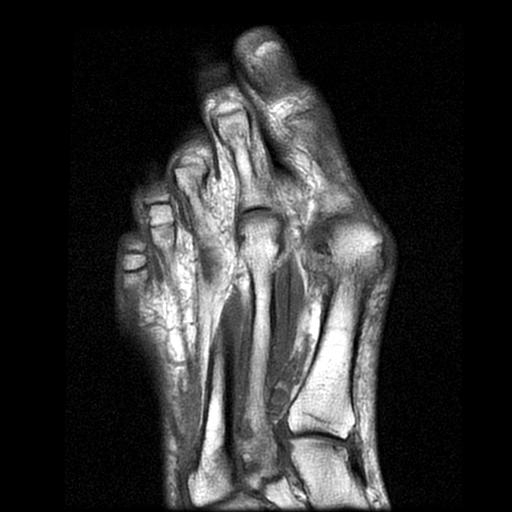
[im 18/18]
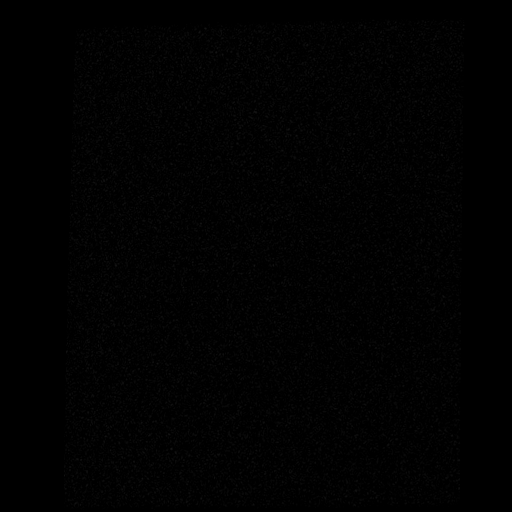

[Series 9: T1 · sagittal · 4.0mm · 0.29mm/px · 3 of 20 slices shown (3 of 3)]
[im 4/20]
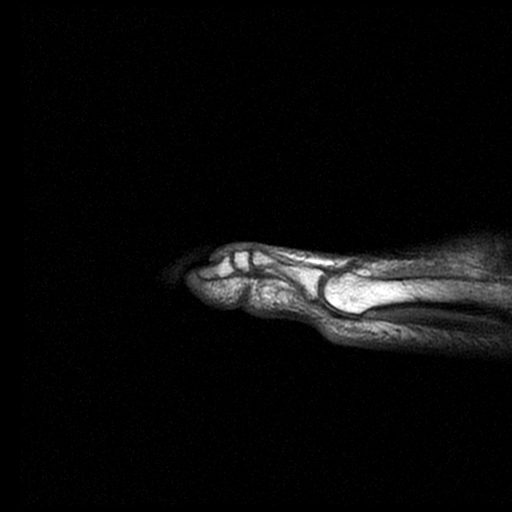
[im 12/20]
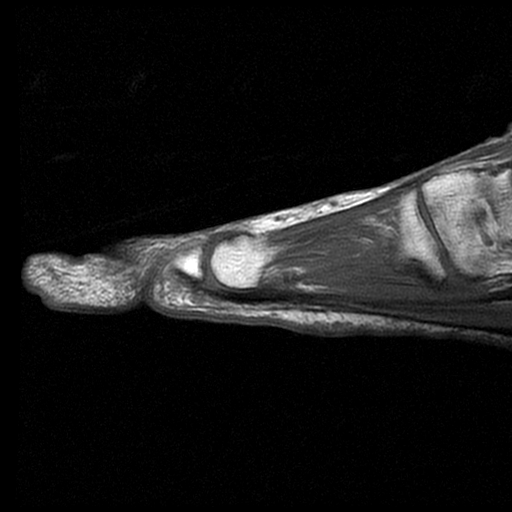
[im 20/20]
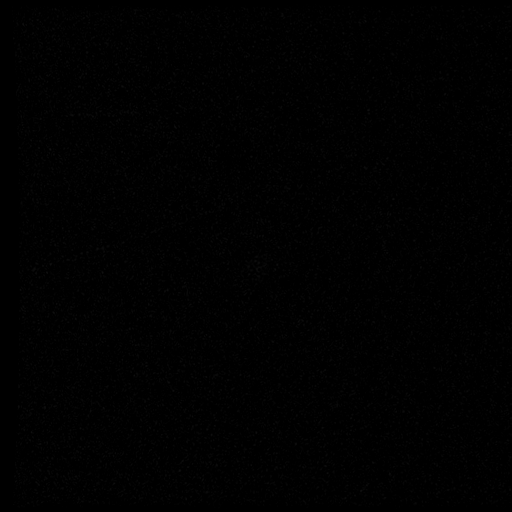

[19 of 40 positions shown; findings below may reference images not displayed]

FINDINGS: There is a moderate hallux valgus deformity measuring approximately
20 degrees. The base of the proximal first phalanx demonstrates no
significant arthropathic changes, although there are degenerative
changes within the first metatarsal head. There are prominent
subchondral cysts within the plantar aspect of the first metatarsal
head with surrounding edema. The tibial sesamoid of the first
metatarsal is bipartite with mild edema. There is minimal associated
joint fluid and no significant periarticular soft tissue thickening.
The flexor hallucis longus tendon appears normal.

The additional toes and metatarsal phalangeal joints appear normal.
There is well demarcated endosteal T2 hyperintensity within the
proximal shaft of the second metatarsal. There is no associated
osseous erosion, and this is likely incidental. The additional
metatarsals appear normal. The alignment at the Lisfranc joint
appears normal. The Lisfranc ligament is intact.
IMPRESSION: 1. Moderate hallux valgus deformity with moderate degenerative
changes at the first MTP joint, primarily within the plantar aspect
of the first metatarsal head and the adjacent tibial sesamoid.
2. Probable incidental endosteal lesion in the second metatarsal
base.
3. No acute findings.

## 2016-12-20 DIAGNOSIS — H469 Unspecified optic neuritis: Secondary | ICD-10-CM | POA: Diagnosis not present

## 2016-12-20 DIAGNOSIS — H35351 Cystoid macular degeneration, right eye: Secondary | ICD-10-CM | POA: Diagnosis not present

## 2016-12-20 DIAGNOSIS — D8989 Other specified disorders involving the immune mechanism, not elsewhere classified: Secondary | ICD-10-CM | POA: Diagnosis not present

## 2016-12-20 DIAGNOSIS — H3500 Unspecified background retinopathy: Secondary | ICD-10-CM | POA: Diagnosis not present

## 2017-01-31 DIAGNOSIS — R69 Illness, unspecified: Secondary | ICD-10-CM | POA: Diagnosis not present

## 2017-02-10 ENCOUNTER — Other Ambulatory Visit: Payer: Self-pay | Admitting: Physician Assistant

## 2017-02-10 DIAGNOSIS — D2262 Melanocytic nevi of left upper limb, including shoulder: Secondary | ICD-10-CM | POA: Diagnosis not present

## 2017-02-10 DIAGNOSIS — D485 Neoplasm of uncertain behavior of skin: Secondary | ICD-10-CM | POA: Diagnosis not present

## 2017-02-10 DIAGNOSIS — L570 Actinic keratosis: Secondary | ICD-10-CM | POA: Diagnosis not present

## 2017-02-10 DIAGNOSIS — D492 Neoplasm of unspecified behavior of bone, soft tissue, and skin: Secondary | ICD-10-CM | POA: Diagnosis not present

## 2017-03-14 DIAGNOSIS — H469 Unspecified optic neuritis: Secondary | ICD-10-CM | POA: Diagnosis not present

## 2017-03-14 DIAGNOSIS — H3500 Unspecified background retinopathy: Secondary | ICD-10-CM | POA: Diagnosis not present

## 2017-03-14 DIAGNOSIS — D8989 Other specified disorders involving the immune mechanism, not elsewhere classified: Secondary | ICD-10-CM | POA: Diagnosis not present

## 2017-03-28 DIAGNOSIS — J0141 Acute recurrent pansinusitis: Secondary | ICD-10-CM | POA: Diagnosis not present

## 2017-03-28 DIAGNOSIS — J060 Acute laryngopharyngitis: Secondary | ICD-10-CM | POA: Diagnosis not present

## 2017-03-28 DIAGNOSIS — J029 Acute pharyngitis, unspecified: Secondary | ICD-10-CM | POA: Diagnosis not present

## 2017-03-28 DIAGNOSIS — J069 Acute upper respiratory infection, unspecified: Secondary | ICD-10-CM | POA: Diagnosis not present

## 2017-06-13 DIAGNOSIS — C44319 Basal cell carcinoma of skin of other parts of face: Secondary | ICD-10-CM | POA: Diagnosis not present

## 2017-06-13 DIAGNOSIS — D8989 Other specified disorders involving the immune mechanism, not elsewhere classified: Secondary | ICD-10-CM | POA: Diagnosis not present

## 2017-06-13 DIAGNOSIS — H40041 Steroid responder, right eye: Secondary | ICD-10-CM | POA: Diagnosis not present

## 2017-06-13 DIAGNOSIS — H468 Other optic neuritis: Secondary | ICD-10-CM | POA: Diagnosis not present

## 2017-06-13 DIAGNOSIS — H469 Unspecified optic neuritis: Secondary | ICD-10-CM | POA: Diagnosis not present

## 2017-06-13 DIAGNOSIS — H3500 Unspecified background retinopathy: Secondary | ICD-10-CM | POA: Diagnosis not present

## 2017-06-13 DIAGNOSIS — H35353 Cystoid macular degeneration, bilateral: Secondary | ICD-10-CM | POA: Diagnosis not present

## 2017-06-13 DIAGNOSIS — Z79899 Other long term (current) drug therapy: Secondary | ICD-10-CM | POA: Diagnosis not present

## 2017-08-31 DIAGNOSIS — Z0100 Encounter for examination of eyes and vision without abnormal findings: Secondary | ICD-10-CM | POA: Diagnosis not present

## 2017-08-31 DIAGNOSIS — H47293 Other optic atrophy, bilateral: Secondary | ICD-10-CM | POA: Diagnosis not present

## 2017-08-31 DIAGNOSIS — H524 Presbyopia: Secondary | ICD-10-CM | POA: Diagnosis not present

## 2017-08-31 DIAGNOSIS — H5053 Vertical heterophoria: Secondary | ICD-10-CM | POA: Diagnosis not present

## 2017-08-31 DIAGNOSIS — H52223 Regular astigmatism, bilateral: Secondary | ICD-10-CM | POA: Diagnosis not present

## 2017-08-31 DIAGNOSIS — H5203 Hypermetropia, bilateral: Secondary | ICD-10-CM | POA: Diagnosis not present

## 2017-08-31 DIAGNOSIS — Z961 Presence of intraocular lens: Secondary | ICD-10-CM | POA: Diagnosis not present

## 2017-09-14 DIAGNOSIS — H468 Other optic neuritis: Secondary | ICD-10-CM | POA: Diagnosis not present

## 2017-09-14 DIAGNOSIS — H35353 Cystoid macular degeneration, bilateral: Secondary | ICD-10-CM | POA: Diagnosis not present

## 2017-09-14 DIAGNOSIS — H3500 Unspecified background retinopathy: Secondary | ICD-10-CM | POA: Diagnosis not present

## 2017-09-14 DIAGNOSIS — H547 Unspecified visual loss: Secondary | ICD-10-CM | POA: Diagnosis not present

## 2017-09-14 DIAGNOSIS — H469 Unspecified optic neuritis: Secondary | ICD-10-CM | POA: Diagnosis not present

## 2017-09-14 DIAGNOSIS — H40043 Steroid responder, bilateral: Secondary | ICD-10-CM | POA: Diagnosis not present

## 2017-09-14 DIAGNOSIS — C44319 Basal cell carcinoma of skin of other parts of face: Secondary | ICD-10-CM | POA: Diagnosis not present

## 2017-09-27 DIAGNOSIS — Z1231 Encounter for screening mammogram for malignant neoplasm of breast: Secondary | ICD-10-CM | POA: Diagnosis not present

## 2017-11-08 DIAGNOSIS — H3500 Unspecified background retinopathy: Secondary | ICD-10-CM | POA: Diagnosis not present

## 2017-11-08 DIAGNOSIS — M1712 Unilateral primary osteoarthritis, left knee: Secondary | ICD-10-CM | POA: Diagnosis not present

## 2017-11-08 DIAGNOSIS — M7050 Other bursitis of knee, unspecified knee: Secondary | ICD-10-CM | POA: Diagnosis not present

## 2017-11-08 DIAGNOSIS — Z79899 Other long term (current) drug therapy: Secondary | ICD-10-CM | POA: Diagnosis not present

## 2017-11-08 DIAGNOSIS — D8989 Other specified disorders involving the immune mechanism, not elsewhere classified: Secondary | ICD-10-CM | POA: Diagnosis not present

## 2017-11-08 DIAGNOSIS — H35353 Cystoid macular degeneration, bilateral: Secondary | ICD-10-CM | POA: Diagnosis not present

## 2017-11-09 ENCOUNTER — Encounter (INDEPENDENT_AMBULATORY_CARE_PROVIDER_SITE_OTHER): Payer: Self-pay

## 2017-11-09 ENCOUNTER — Ambulatory Visit (INDEPENDENT_AMBULATORY_CARE_PROVIDER_SITE_OTHER): Payer: Medicare HMO | Admitting: Orthopaedic Surgery

## 2017-11-09 ENCOUNTER — Ambulatory Visit (INDEPENDENT_AMBULATORY_CARE_PROVIDER_SITE_OTHER): Payer: Medicare HMO

## 2017-11-09 ENCOUNTER — Encounter (INDEPENDENT_AMBULATORY_CARE_PROVIDER_SITE_OTHER): Payer: Self-pay | Admitting: Orthopaedic Surgery

## 2017-11-09 ENCOUNTER — Ambulatory Visit (INDEPENDENT_AMBULATORY_CARE_PROVIDER_SITE_OTHER): Payer: Self-pay | Admitting: Orthopaedic Surgery

## 2017-11-09 VITALS — BP 125/83 | HR 54 | Ht 65.0 in | Wt 160.0 lb

## 2017-11-09 DIAGNOSIS — M25562 Pain in left knee: Secondary | ICD-10-CM | POA: Diagnosis not present

## 2017-11-09 DIAGNOSIS — G8929 Other chronic pain: Secondary | ICD-10-CM

## 2017-11-09 MED ORDER — BUPIVACAINE HCL 0.5 % IJ SOLN
2.0000 mL | INTRAMUSCULAR | Status: AC | PRN
Start: 2017-11-09 — End: 2017-11-09
  Administered 2017-11-09: 2 mL via INTRA_ARTICULAR

## 2017-11-09 MED ORDER — LIDOCAINE HCL 1 % IJ SOLN
2.0000 mL | INTRAMUSCULAR | Status: AC | PRN
  Administered 2017-11-09: 2 mL

## 2017-11-09 MED ORDER — METHYLPREDNISOLONE ACETATE 40 MG/ML IJ SUSP
80.0000 mg | INTRAMUSCULAR | Status: AC | PRN
  Administered 2017-11-09: 80 mg

## 2017-11-09 NOTE — Progress Notes (Signed)
Office Visit Note   Patient: Melinda [name]           Date of Birth: 22-Aug-1950           MRN: [id number] Visit Date: 11/09/2017              Requested by: Leighton Ruff, MD [place], [place] [address] PCP: Harlan Stains, MD   Assessment & Plan: Visit Diagnoses:  1. Chronic pain of left knee     Plan:  #1: Corticosteroid injection to the left knee accomplished atraumatically 2: If no improvement then we will schedule her for an MRI scan of her left knee.  Follow-Up Instructions: No follow-ups on file.   Orders:  Orders Placed This Encounter  Procedures  . XR KNEE 3 VIEW LEFT  . XR HIP UNILAT W OR W/O PELVIS 1V LEFT   No orders of the defined types were placed in this encounter.     Procedures: Large Joint Inj: L knee on 11/09/2017 2:34 PM Indications: pain and diagnostic evaluation Details: 25 G 1.5 in needle, anteromedial approach  Arthrogram: No  Medications: 2 mL lidocaine 1 %; 2 mL bupivacaine 0.5 %; 80 mg methylPREDNISolone acetate 40 MG/ML Procedure, treatment alternatives, risks and benefits explained, specific risks discussed. Consent was given by the patient. Immediately prior to procedure a time out was called to verify the correct patient, procedure, equipment, support staff and site/side marked as required. Patient was prepped and draped in the usual sterile fashion.       Clinical Data: No additional findings.   Subjective: Chief Complaint  Patient presents with  . Follow-up    L KNEE PAIN FOR 3 MO NO INJURY OR INJECTIONS    HPI  [name] is a very pleasant 67 year old white female who is seen today for evaluation of her left knee.  She states she has had an insidious onset of pain in her knee without any history of injury or trauma.  Her pain is more laterally along the proximal tibia at the joint line and does radiate over to the medial aspect of her knee.  She has had previous surgery area the proximal tib-fib.  She did have  an excision on 12 March 2009 of a very large ganglion cyst originating from the proximal tib-fib joint impingement upon the peroneal nerve.  She also had a small tear of the posterior horn and root of the lateral meniscus.  Review of Systems  Constitutional: Negative for fatigue and fever.  HENT: Negative for ear pain.   Eyes: Negative for pain.  Respiratory: Negative for cough and shortness of breath.   Cardiovascular: Negative for leg swelling.  Gastrointestinal: Negative for constipation and diarrhea.  Genitourinary: Negative for difficulty urinating.  Musculoskeletal: Negative for back pain and neck pain.  Skin: Negative for rash.  Allergic/Immunologic: Negative for food allergies.  Neurological: Positive for weakness and numbness.  Hematological: Bruises/bleeds easily.  Psychiatric/Behavioral: Positive for sleep disturbance.     Objective: Vital Signs: BP 125/83 (BP Location: Left Arm, Patient Position: Sitting, Cuff Size: Normal)   Pulse (!) 54   Ht 5\' 5"  (1.651 m)   Wt 160 lb (72.6 kg)   LMP 01/01/2015   BMI 26.63 kg/m   Physical Exam  Constitutional: She is oriented to person, place, and time. She appears well-developed and well-nourished.  HENT:  Mouth/Throat: Oropharynx is clear and moist.  Eyes: Pupils are equal, round, and reactive to light. EOM are normal.  Pulmonary/Chest: Effort normal.  Neurological: She is alert and oriented to person, place, and time.  Skin: Skin is warm and dry.  Psychiatric: She has a normal mood and affect. Her behavior is normal.    Ortho Exam  Exam today reveals range of motion of the knee from full extension to 105 degrees.  She does have some tenderness to palpation over the lateral joint line more than medial joint line.  McMurray's does give her the pain at the lateral joint line.  Some very fine crepitance with range of motion.  Trace effusion at best.  Neurovascular intact distally.  Specialty Comments:  No specialty comments  available.  Imaging: No results found.   PMFS History: There are no active problems to display for this patient.  Past Medical History:  Diagnosis Date  . Retina disorder, bilateral     History reviewed. No pertinent family history.  Past Surgical History:  Procedure Laterality Date  . ABDOMINAL HYSTERECTOMY  1979  . BACK SURGERY    . CATARACT EXTRACTION  2012  . GALLBLADDER SURGERY  2003  . KNEE SURGERY     Social History   Occupational History  . Not on file  Tobacco Use  . Smoking status: Former Smoker    Last attempt to quit: 2013    Years since quitting: 6.6  . Smokeless tobacco: Never Used  Substance and Sexual Activity  . Alcohol use: Not Currently  . Drug use: Not Currently  . Sexual activity: Not on file

## 2017-12-12 DIAGNOSIS — H468 Other optic neuritis: Secondary | ICD-10-CM | POA: Diagnosis not present

## 2017-12-12 DIAGNOSIS — H35353 Cystoid macular degeneration, bilateral: Secondary | ICD-10-CM | POA: Diagnosis not present

## 2017-12-12 DIAGNOSIS — H3500 Unspecified background retinopathy: Secondary | ICD-10-CM | POA: Diagnosis not present

## 2017-12-12 DIAGNOSIS — C44319 Basal cell carcinoma of skin of other parts of face: Secondary | ICD-10-CM | POA: Diagnosis not present

## 2017-12-12 DIAGNOSIS — H469 Unspecified optic neuritis: Secondary | ICD-10-CM | POA: Diagnosis not present

## 2018-02-13 DIAGNOSIS — Z85828 Personal history of other malignant neoplasm of skin: Secondary | ICD-10-CM | POA: Diagnosis not present

## 2018-02-13 DIAGNOSIS — D229 Melanocytic nevi, unspecified: Secondary | ICD-10-CM | POA: Diagnosis not present

## 2018-04-24 DIAGNOSIS — H35353 Cystoid macular degeneration, bilateral: Secondary | ICD-10-CM | POA: Diagnosis not present

## 2018-04-24 DIAGNOSIS — H3500 Unspecified background retinopathy: Secondary | ICD-10-CM | POA: Diagnosis not present

## 2018-04-24 DIAGNOSIS — H31013 Macula scars of posterior pole (postinflammatory) (post-traumatic), bilateral: Secondary | ICD-10-CM | POA: Diagnosis not present

## 2018-04-24 DIAGNOSIS — D8989 Other specified disorders involving the immune mechanism, not elsewhere classified: Secondary | ICD-10-CM | POA: Diagnosis not present

## 2018-04-24 DIAGNOSIS — C44319 Basal cell carcinoma of skin of other parts of face: Secondary | ICD-10-CM | POA: Diagnosis not present

## 2018-04-24 DIAGNOSIS — H3553 Other dystrophies primarily involving the sensory retina: Secondary | ICD-10-CM | POA: Diagnosis not present

## 2018-04-24 DIAGNOSIS — H3550 Unspecified hereditary retinal dystrophy: Secondary | ICD-10-CM | POA: Diagnosis not present

## 2018-04-24 DIAGNOSIS — Z135 Encounter for screening for eye and ear disorders: Secondary | ICD-10-CM | POA: Diagnosis not present

## 2018-04-24 DIAGNOSIS — Z1589 Genetic susceptibility to other disease: Secondary | ICD-10-CM | POA: Diagnosis not present

## 2018-04-24 DIAGNOSIS — H469 Unspecified optic neuritis: Secondary | ICD-10-CM | POA: Diagnosis not present

## 2018-06-01 DIAGNOSIS — H35353 Cystoid macular degeneration, bilateral: Secondary | ICD-10-CM | POA: Diagnosis not present

## 2018-06-01 DIAGNOSIS — Z1589 Genetic susceptibility to other disease: Secondary | ICD-10-CM | POA: Diagnosis not present

## 2018-06-01 DIAGNOSIS — Z135 Encounter for screening for eye and ear disorders: Secondary | ICD-10-CM | POA: Diagnosis not present

## 2018-06-01 DIAGNOSIS — H3550 Unspecified hereditary retinal dystrophy: Secondary | ICD-10-CM | POA: Diagnosis not present

## 2018-08-21 DIAGNOSIS — H3581 Retinal edema: Secondary | ICD-10-CM | POA: Diagnosis not present

## 2018-08-21 DIAGNOSIS — H31013 Macula scars of posterior pole (postinflammatory) (post-traumatic), bilateral: Secondary | ICD-10-CM | POA: Diagnosis not present

## 2018-08-21 DIAGNOSIS — H35353 Cystoid macular degeneration, bilateral: Secondary | ICD-10-CM | POA: Diagnosis not present

## 2018-08-21 DIAGNOSIS — H469 Unspecified optic neuritis: Secondary | ICD-10-CM | POA: Diagnosis not present

## 2018-08-21 DIAGNOSIS — H3500 Unspecified background retinopathy: Secondary | ICD-10-CM | POA: Diagnosis not present

## 2018-08-21 DIAGNOSIS — H468 Other optic neuritis: Secondary | ICD-10-CM | POA: Diagnosis not present

## 2018-08-21 DIAGNOSIS — D8989 Other specified disorders involving the immune mechanism, not elsewhere classified: Secondary | ICD-10-CM | POA: Diagnosis not present

## 2018-08-21 DIAGNOSIS — C44319 Basal cell carcinoma of skin of other parts of face: Secondary | ICD-10-CM | POA: Diagnosis not present

## 2018-12-10 DIAGNOSIS — Z1231 Encounter for screening mammogram for malignant neoplasm of breast: Secondary | ICD-10-CM | POA: Diagnosis not present

## 2019-01-01 ENCOUNTER — Ambulatory Visit: Payer: Medicare HMO | Admitting: Orthopaedic Surgery

## 2019-01-01 ENCOUNTER — Encounter: Payer: Self-pay | Admitting: Orthopaedic Surgery

## 2019-01-01 ENCOUNTER — Other Ambulatory Visit: Payer: Self-pay

## 2019-01-01 ENCOUNTER — Ambulatory Visit (INDEPENDENT_AMBULATORY_CARE_PROVIDER_SITE_OTHER): Payer: Medicare HMO

## 2019-01-01 VITALS — BP 123/82 | HR 58 | Ht 65.0 in | Wt 168.0 lb

## 2019-01-01 DIAGNOSIS — M25532 Pain in left wrist: Secondary | ICD-10-CM

## 2019-01-01 DIAGNOSIS — I10 Essential (primary) hypertension: Secondary | ICD-10-CM | POA: Diagnosis not present

## 2019-01-01 DIAGNOSIS — G8929 Other chronic pain: Secondary | ICD-10-CM | POA: Diagnosis not present

## 2019-01-01 DIAGNOSIS — M25552 Pain in left hip: Secondary | ICD-10-CM | POA: Diagnosis not present

## 2019-01-01 DIAGNOSIS — M65932 Unspecified synovitis and tenosynovitis, left forearm: Secondary | ICD-10-CM

## 2019-01-01 DIAGNOSIS — M659 Synovitis and tenosynovitis, unspecified: Secondary | ICD-10-CM | POA: Diagnosis not present

## 2019-01-01 DIAGNOSIS — M1612 Unilateral primary osteoarthritis, left hip: Secondary | ICD-10-CM | POA: Diagnosis not present

## 2019-01-01 NOTE — Progress Notes (Signed)
Office Visit Note   Patient: Melinda [name]           Date of Birth: [birth year]           MRN: [id number] Visit Date: 01/01/2019              Requested by: Harlan Stains, MD [place],  [place] [address] PCP: Harlan Stains, MD   Assessment & Plan: Visit Diagnoses:  1. Pain in left wrist   2. Tenosynovitis of left wrist     Plan:  #1: Short wrist splint to the left wrist #2: Voltaren gel to the ulnar aspect of the wrist and hand #3: If this is not beneficial she may return and we can try corticosteroid injections area.  At this time I feel that a more conservative approach may be beneficial for her.   Follow-Up Instructions: Return if symptoms worsen or fail to improve.   Orders:  Orders Placed This Encounter  Procedures  . XR Wrist 2 Views Left   No orders of the defined types were placed in this encounter.     Procedures: No procedures performed   Clinical Data: No additional findings.   Subjective: Chief Complaint  Patient presents with  . Left Wrist - Pain  HPI: Patient presents today with left ulnar wrist pain. She has been having pain for three weeks. She noticed a knot on the ulnar side that has decreased some. She said that she has pain that radiates into her left ring and pinky finger. No known injury.    Review of Systems  Constitutional: Negative for fatigue.  HENT: Negative for ear pain.   Eyes: Positive for pain.  Respiratory: Negative for shortness of breath.   Cardiovascular: Negative for leg swelling.  Gastrointestinal: Negative for constipation and diarrhea.  Endocrine: Negative for cold intolerance and heat intolerance.  Genitourinary: Negative for difficulty urinating.  Musculoskeletal: Negative for joint swelling.  Skin: Negative for rash.  Allergic/Immunologic: Negative for food allergies.  Neurological: Negative for weakness.  Hematological: Does not bruise/bleed easily.  Psychiatric/Behavioral: Negative  for sleep disturbance.     Objective: Vital Signs: BP 123/82   Pulse (!) 58   Ht 5\' 5"  (1.651 m)   Wt 168 lb (76.2 kg)   LMP 01/01/2015   BMI 27.96 kg/m   Physical Exam Constitutional:      Appearance: Normal appearance. She is well-developed and normal weight.  HENT:     Head: Normocephalic.  Eyes:     Pupils: Pupils are equal, round, and reactive to light.  Pulmonary:     Effort: Pulmonary effort is normal.  Skin:    General: Skin is warm and dry.  Neurological:     Mental Status: She is alert and oriented to person, place, and time.  Psychiatric:        Behavior: Behavior normal.     Ortho Exam  Exam today reveals some prominence over the ulnar aspect of the ulnar wrist joint.  Not much over the dorsal or volar aspect of the ulnar aspect of the wrist.  He is got full range of motion.  She dorsiflexes somewhere between the 50 to 60degrees and volar flexes same.  Full pronation supination.  Good grip strength.  Good capillary refill into the hand.  Skin is intact without any ecchymosis or erythema.  No pain radially or proximal to the wrist.  Specialty Comments:  No specialty comments available.  Imaging: Xr Wrist 2 Views Left  Result Date: 01/01/2019 X-ray 3 views of the left wrist not reveal much in way of any pathology over the ulnar aspect of the wrist.  Is been reviewed by Dr. Durward Fortes and concurs with this.    PMFS History: Current Outpatient Medications  Medication Sig Dispense Refill  . Cholecalciferol (VITAMIN D3) 2000 units capsule Take by mouth.    . dorzolamide (TRUSOPT) 2 % ophthalmic solution INSTILL 1 DROP INTO BOTH EYES 3 TIMES A DAY    . Multiple Vitamin (MULTI-VITAMINS) TABS Take by mouth.    . timolol (TIMOPTIC) 0.5 % ophthalmic solution INSTILL 1 DROP INTO BOTH EYES TWICE A DAY     No current facility-administered medications for this visit.     Patient Active Problem List   Diagnosis Date Noted  . Tenosynovitis of left wrist 01/01/2019    Past Medical History:  Diagnosis Date  . 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 Atypical nevus 09/13/2011   Left Upper Arm  . Atypical nevus 08/21/2014   Upper Back - Mild, and Lower Back - Mild  . Atypical nevus 02/10/2017   Upper Left Arm - Mild  . BCC (basal cell carcinoma of skin) 12/13/2010   Left Temple  . Retina disorder, bilateral   . Superficial basal cell carcinoma (BCC) 08/24/2010   Left Temple    History reviewed. No pertinent family history.  Past Surgical History:  Procedure Laterality Date  . ABDOMINAL HYSTERECTOMY  1979  . BACK SURGERY    . CATARACT EXTRACTION  2012  . GALLBLADDER SURGERY  2003  . KNEE SURGERY     Social History   Occupational History  . Not on file  Tobacco Use  . Smoking status: Former Smoker    Quit date: 2013    Years since quitting: 7.7  . Smokeless tobacco: Never Used  Substance and Sexual Activity  . Alcohol use: Not Currently  . Drug use: Not Currently  . Sexual activity: Not on file

## 2019-01-29 DIAGNOSIS — H31013 Macula scars of posterior pole (postinflammatory) (post-traumatic), bilateral: Secondary | ICD-10-CM | POA: Diagnosis not present

## 2019-01-29 DIAGNOSIS — D8989 Other specified disorders involving the immune mechanism, not elsewhere classified: Secondary | ICD-10-CM | POA: Diagnosis not present

## 2019-01-29 DIAGNOSIS — H468 Other optic neuritis: Secondary | ICD-10-CM | POA: Diagnosis not present

## 2019-01-29 DIAGNOSIS — H469 Unspecified optic neuritis: Secondary | ICD-10-CM | POA: Diagnosis not present

## 2019-01-29 DIAGNOSIS — H3500 Unspecified background retinopathy: Secondary | ICD-10-CM | POA: Diagnosis not present

## 2019-01-29 DIAGNOSIS — H35353 Cystoid macular degeneration, bilateral: Secondary | ICD-10-CM | POA: Diagnosis not present

## 2019-04-02 DIAGNOSIS — R69 Illness, unspecified: Secondary | ICD-10-CM | POA: Diagnosis not present

## 2019-05-07 DIAGNOSIS — R69 Illness, unspecified: Secondary | ICD-10-CM | POA: Diagnosis not present

## 2019-07-02 DIAGNOSIS — H30043 Focal chorioretinal inflammation, macular or paramacular, bilateral: Secondary | ICD-10-CM | POA: Diagnosis not present

## 2019-09-05 DIAGNOSIS — H524 Presbyopia: Secondary | ICD-10-CM | POA: Diagnosis not present

## 2019-10-02 DIAGNOSIS — H3589 Other specified retinal disorders: Secondary | ICD-10-CM | POA: Diagnosis not present

## 2019-10-02 DIAGNOSIS — G131 Other systemic atrophy primarily affecting central nervous system in neoplastic disease: Secondary | ICD-10-CM | POA: Diagnosis not present

## 2019-10-02 DIAGNOSIS — H30043 Focal chorioretinal inflammation, macular or paramacular, bilateral: Secondary | ICD-10-CM | POA: Diagnosis not present

## 2019-10-02 DIAGNOSIS — H43823 Vitreomacular adhesion, bilateral: Secondary | ICD-10-CM | POA: Diagnosis not present

## 2019-11-14 DIAGNOSIS — R69 Illness, unspecified: Secondary | ICD-10-CM | POA: Diagnosis not present

## 2019-11-15 ENCOUNTER — Ambulatory Visit: Payer: Medicare HMO | Admitting: Physician Assistant

## 2019-11-15 ENCOUNTER — Other Ambulatory Visit: Payer: Self-pay

## 2019-11-15 ENCOUNTER — Encounter: Payer: Self-pay | Admitting: Physician Assistant

## 2019-11-15 DIAGNOSIS — Z85828 Personal history of other malignant neoplasm of skin: Secondary | ICD-10-CM

## 2019-11-15 DIAGNOSIS — Z86018 Personal history of other benign neoplasm: Secondary | ICD-10-CM | POA: Diagnosis not present

## 2019-11-15 DIAGNOSIS — L570 Actinic keratosis: Secondary | ICD-10-CM | POA: Diagnosis not present

## 2019-11-15 DIAGNOSIS — D229 Melanocytic nevi, unspecified: Secondary | ICD-10-CM | POA: Diagnosis not present

## 2019-11-15 DIAGNOSIS — C4491 Basal cell carcinoma of skin, unspecified: Secondary | ICD-10-CM

## 2019-11-15 DIAGNOSIS — D1800 Hemangioma unspecified site: Secondary | ICD-10-CM

## 2019-11-15 DIAGNOSIS — D485 Neoplasm of uncertain behavior of skin: Secondary | ICD-10-CM

## 2019-11-15 DIAGNOSIS — Z1283 Encounter for screening for malignant neoplasm of skin: Secondary | ICD-10-CM | POA: Diagnosis not present

## 2019-11-15 DIAGNOSIS — L814 Other melanin hyperpigmentation: Secondary | ICD-10-CM | POA: Diagnosis not present

## 2019-11-15 DIAGNOSIS — C44619 Basal cell carcinoma of skin of left upper limb, including shoulder: Secondary | ICD-10-CM | POA: Diagnosis not present

## 2019-11-15 DIAGNOSIS — L821 Other seborrheic keratosis: Secondary | ICD-10-CM

## 2019-11-15 DIAGNOSIS — L578 Other skin changes due to chronic exposure to nonionizing radiation: Secondary | ICD-10-CM

## 2019-11-15 HISTORY — DX: Basal cell carcinoma of skin, unspecified: C44.91

## 2019-11-15 NOTE — Patient Instructions (Signed)

## 2019-11-15 NOTE — Progress Notes (Deleted)
   Follow-Up Visit   Subjective  Melinda [name] is a 68 y.o. female who presents for the following: Follow-up.   The following portions of the chart were reviewed this encounter and updated as appropriate: Tobacco  Allergies  Meds  Problems  Med Hx  Surg Hx  Fam Hx      Objective  Well appearing patient in no apparent distress; mood and affect are within normal limits.  A full examination was performed including scalp, head, eyes, ears, nose, lips, neck, chest, axillae, abdomen, back, buttocks, bilateral upper extremities, bilateral lower extremities, hands, feet, fingers, toes, fingernails, and toenails. All findings within normal limits unless otherwise noted below.  Objective  Head - to toe: No atypical nevi No signs of non-mole skin cancer.   Objective  Left Antecubital Fossa, Mid Back: Scars clear  Objective  Left temple: Scars clear  Objective  Left Shoulder - Anterior: Txpbx-ed&c 1.0     Objective  Right Eyebrow, Right Nasal Sidewall: Erythematous patches with gritty scale.   Assessment & Plan  Screening exam for skin cancer Head - to toe  Yearly skin exams  History of dysplastic nevus (2) Left Antecubital Fossa; Mid Back  observe  History of basal cell carcinoma (BCC) Left temple  observe  Neoplasm of uncertain behavior of skin Left Shoulder - Anterior  Skin / nail biopsy Type of biopsy: tangential   Informed consent: discussed and consent obtained   Timeout: patient name, date of birth, surgical site, and procedure verified   Procedure prep:  Patient was prepped and draped in usual sterile fashion (Non sterile) Prep type:  Chlorhexidine Anesthesia: the lesion was anesthetized in a standard fashion   Anesthetic:  1% lidocaine w/ epinephrine 1-100,000 local infiltration Instrument used: flexible razor blade   Outcome: patient tolerated procedure well   Post-procedure details: wound care instructions given    Specimen 1 - Surgical  pathology Differential Diagnosis: scc vs bcc Check Margins: No  AK (actinic keratosis) (2) Right Eyebrow; Right Nasal Sidewall  Destruction of lesion - Right Eyebrow, Right Nasal Sidewall Complexity: simple   Destruction method: cryotherapy   Informed consent: discussed and consent obtained   Timeout:  patient name, date of birth, surgical site, and procedure verified Lesion destroyed using liquid nitrogen: Yes   Outcome: patient tolerated procedure well with no complications      I, Mikell Camp, PA-C, have reviewed all documentation's for this visit.  The documentation on 11/15/19 for the exam, diagnosis, procedures and orders are all accurate and complete.

## 2019-11-15 NOTE — Progress Notes (Addendum)
Follow-Up Visit   Subjective  Melinda [name] is a 69 y.o. female who presents for the following: Follow-up.   The following portions of the chart were reviewed this encounter and updated as appropriate: Tobacco  Allergies  Meds  Problems  Med Hx  Surg Hx  Fam Hx      Objective  Well appearing patient in no apparent distress; mood and affect are within normal limits.  A full examination was performed including scalp, head, eyes, ears, nose, lips, neck, chest, axillae, abdomen, back, buttocks, bilateral upper extremities, bilateral lower extremities, hands, feet, fingers, toes, fingernails, and toenails. All findings within normal limits unless otherwise noted below.  Objective  Head - to toe: No atypical nevi No signs of non-mole skin cancer.   Objective  Left Antecubital Fossa, Mid Back: Scars clear  Objective  Left temple: Scars clear  Objective  Right Eyebrow, Right Nasal Sidewall: Erythematous patches with gritty scale.  Objective  Left Shoulder - Anterior: Pearly papule with telangectasia. Txpbx-ed&c 1.0     Assessment & Plan  Screening exam for skin cancer Head - to toe  Yearly skin exams  History of dysplastic nevus (2) Left Antecubital Fossa; Mid Back  observe  History of basal cell carcinoma (BCC) Left temple  observe  AK (actinic keratosis) (2) Right Eyebrow; Right Nasal Sidewall  Destruction of lesion - Right Eyebrow, Right Nasal Sidewall Complexity: simple   Destruction method: cryotherapy   Informed consent: discussed and consent obtained   Timeout:  patient name, date of birth, surgical site, and procedure verified Lesion destroyed using liquid nitrogen: Yes   Outcome: patient tolerated procedure well with no complications    Basal cell carcinoma (BCC) of skin of left upper extremity including shoulder Left Shoulder - Anterior  Skin / nail biopsy Type of biopsy: tangential   Informed consent: discussed and consent obtained     Timeout: patient name, date of birth, surgical site, and procedure verified   Procedure prep:  Patient was prepped and draped in usual sterile fashion (Non sterile) Prep type:  Chlorhexidine Anesthesia: the lesion was anesthetized in a standard fashion   Anesthetic:  1% lidocaine w/ epinephrine 1-100,000 local infiltration Instrument used: flexible razor blade   Outcome: patient tolerated procedure well   Post-procedure details: wound care instructions given    Destruction of lesion Complexity: simple   Destruction method: electrodesiccation and curettage   Informed consent: discussed and consent obtained   Timeout:  patient name, date of birth, surgical site, and procedure verified Anesthesia: the lesion was anesthetized in a standard fashion   Anesthetic:  1% lidocaine w/ epinephrine 1-100,000 local infiltration Curettage performed in three different directions: Yes   Electrodesiccation performed over the curetted area: Yes   Curettage cycles:  3 Lesion length (cm):  1 Lesion width (cm):  1 Margin per side (cm):  0.1 Final wound size (cm):  1.2 Hemostasis achieved with:  aluminum chloride Outcome: patient tolerated procedure well with no complications   Post-procedure details: wound care instructions given    Specimen 1 - Surgical pathology Differential Diagnosis: scc vs bcc Check Margins: No  Lentigines - Scattered tan macules - Discussed due to sun exposure - Benign, observe - Call for any changes  Seborrheic Keratoses - Stuck-on, waxy, tan-brown papules and plaques  - Discussed benign etiology and prognosis. - Observe - Call for any changes  Melanocytic Nevi - Tan-brown and/or pink-flesh-colored symmetric macules and papules - Benign appearing on exam today - Observation - Call clinic  for new or changing moles - Recommend daily use of broad spectrum spf 30+ sunscreen to sun-exposed areas.   Hemangiomas - Red papules - Discussed benign nature - Observe -  Call for any changes  Actinic Damage - diffuse scaly erythematous macules with underlying dyspigmentation - Recommend daily broad spectrum sunscreen SPF 30+ to sun-exposed areas, reapply every 2 hours as needed.  - Call for new or changing lesions.  Skin cancer screening performed today.   I, Hildy Nicholl, PA-C, have reviewed all documentation's for this visit.  The documentation on 11/19/19 for the exam, diagnosis, procedures and orders are all accurate and complete.

## 2019-11-20 ENCOUNTER — Encounter: Payer: Self-pay | Admitting: *Deleted

## 2019-11-20 ENCOUNTER — Telehealth: Payer: Self-pay | Admitting: *Deleted

## 2019-11-20 NOTE — Telephone Encounter (Signed)
-----   Message from Warren Danes, [place] sent at 11/19/2019  1:49 PM EDT ----- RTC PRN

## 2019-11-20 NOTE — Telephone Encounter (Signed)
Pathology to patient, Spot already treated told no need for follow up unless spot comes back.

## 2019-12-12 DIAGNOSIS — Z1231 Encounter for screening mammogram for malignant neoplasm of breast: Secondary | ICD-10-CM | POA: Diagnosis not present

## 2020-01-15 DIAGNOSIS — H3589 Other specified retinal disorders: Secondary | ICD-10-CM | POA: Diagnosis not present

## 2020-01-15 DIAGNOSIS — G131 Other systemic atrophy primarily affecting central nervous system in neoplastic disease: Secondary | ICD-10-CM | POA: Diagnosis not present

## 2020-01-15 DIAGNOSIS — H43823 Vitreomacular adhesion, bilateral: Secondary | ICD-10-CM | POA: Diagnosis not present

## 2020-06-21 DIAGNOSIS — M79652 Pain in left thigh: Secondary | ICD-10-CM | POA: Diagnosis not present

## 2020-06-21 DIAGNOSIS — M79605 Pain in left leg: Secondary | ICD-10-CM | POA: Diagnosis not present

## 2020-06-24 ENCOUNTER — Encounter: Payer: Self-pay | Admitting: Orthopaedic Surgery

## 2020-06-24 ENCOUNTER — Ambulatory Visit: Payer: Medicare HMO | Admitting: Orthopaedic Surgery

## 2020-06-24 ENCOUNTER — Telehealth: Payer: Self-pay

## 2020-06-24 ENCOUNTER — Other Ambulatory Visit: Payer: Self-pay

## 2020-06-24 ENCOUNTER — Ambulatory Visit (INDEPENDENT_AMBULATORY_CARE_PROVIDER_SITE_OTHER): Payer: Medicare HMO

## 2020-06-24 VITALS — Ht 65.0 in | Wt 168.0 lb

## 2020-06-24 DIAGNOSIS — M79605 Pain in left leg: Secondary | ICD-10-CM | POA: Diagnosis not present

## 2020-06-24 MED ORDER — METHYLPREDNISOLONE 4 MG PO TBPK: ORAL_TABLET | ORAL | 0 refills | Status: AC

## 2020-06-24 NOTE — Progress Notes (Signed)
Office Visit Note   Patient: Melinda [name]           Date of Birth: [birth year]           MRN: [id number] Visit Date: 06/24/2020              Requested by: Harlan Stains, MD [place],  [place] [id number] PCP: Harlan Stains, MD   Assessment & Plan: Visit Diagnoses:  1. Pain in left leg     Plan: [name] experienced acute onset of left leg pain this past Friday night.  She denies any history of injury or trauma.  She initially had pain in the area of her knee but without any obvious changes.  Over period of hours she began to develop pain in her left thigh and then eventually even had some numbness in her left foot.  She has had prior excision of a cyst about the peroneal nerve many years ago but has not had any pain up until just recently and she has had prior back surgery.  She was seen at one of the [facility] with x-rays of her femur.  The report was negative for any pathology.  I obtain films of her knee which were also benign.  It is difficult for her to localize her pain.  It seems as though it is in the knee but the knee is benign in terms of exam without effusion redness or heat or local tenderness.  This certainly is a possibility some of this pain could be referred from her back.  There is no suggestion of any problem with her hip.  I am going to place her on a Medrol Dosepak and monitor her response.  Neck step may be to inject her knee to see if that made a difference.  Hopefully this will resolve without further treatment or it might just localized  Follow-Up Instructions: Return in about 2 weeks (around 07/08/2020).   Orders:  Orders Placed This Encounter  Procedures  . XR KNEE 3 VIEW LEFT   Meds ordered this encounter  Medications  . methylPREDNISolone (MEDROL DOSEPAK) 4 MG TBPK tablet    Sig: Take as directed on package    Dispense:  21 tablet    Refill:  0      Procedures: No procedures performed   Clinical Data: No  additional findings.   Subjective: Chief Complaint  Patient presents with  . Left Leg - Pain  Patient presents today for left leg pain. She said that it started when getting in bed Friday night after watching some games. She had no injury. The pain was intense and kept her awake. She said that the pain is located in her knee and thigh. She went to [facility] on Sunday. She states that they took x-rays and could not find anything wrong. She was given Meloxicam. She states that this morning she woke with numbness in her left foot. She has not noticed improvement. She does have a history of previous back surgery.  X-ray report is on care everywhere.  Films of the femur did not demonstrate any obvious pathology.  Presently the pain seems to be localized to the knee but without any changes.  She feels like it is a little bit stiff.  Again, no injury or trauma.  She has some residual numbness in the dorsum of her left foot from excision of a peroneal nerve cyst many years ago but no change or  the numbness that she was experiencing several days ago seems to have resolved.  Not having any back pain  HPI  Review of Systems   Objective: Vital Signs: Ht 5\' 5"  (1.651 m)   Wt 168 lb (76.2 kg)   LMP 01/01/2015   BMI 27.96 kg/m   Physical Exam Constitutional:      Appearance: She is well-developed.  Eyes:     Pupils: Pupils are equal, round, and reactive to light.  Pulmonary:     Effort: Pulmonary effort is normal.  Skin:    General: Skin is warm and dry.  Neurological:     Mental Status: She is alert and oriented to person, place, and time.  Psychiatric:        Behavior: Behavior normal.     Ortho Exam awake alert and oriented x3.  Comfortable sitting prior incision incision over the peroneal nerve proximally about the fibular head.  There is just minimal Tinel's over the incision site but she relates that is no different than it has been in the past.  Motor exam intact distally.   Some mild decrease sensibility across the dorsum of her toes but relates that also was normal.  No ankle pain.  Straight leg raise negative.  Painless range of motion of both hips.  No localized areas of tenderness about her left groin or lateral left hip.  No percussible tenderness of lumbar spine.  Left knee exam was benign also without effusion increased heat or redness.  No localized areas of tenderness but she did have some difficulty crossing her left leg over her right thigh related to her knee.  There was no effusion or evidence of instability Specialty Comments:  No specialty comments available.  Imaging: XR KNEE 3 VIEW LEFT  Result Date: 06/24/2020 Films of the left knee obtained in 3 projections standing.  Joint spaces appear to be well-maintained without irregularity.  No peripheral osteophytes or subchondral sclerosis.  No ectopic calcification or acute change.  Patella tracks in the midline.  Films are nondiagnostic for the patient's pain    PMFS History: Patient Active Problem List   Diagnosis Date Noted  . Pain in left leg 06/24/2020  . Tenosynovitis of left wrist 01/01/2019   Past Medical History:  Diagnosis Date  . 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 Atypical nevus 09/13/2011   Left Upper Arm  . Atypical nevus 08/21/2014   Upper Back - Mild, and Lower Back - Mild  . Atypical nevus 02/10/2017   Upper Left Arm - Mild  . Basal cell carcinoma of skin 11/15/2019   left shoulder-anterior-CX35FU  . BCC (basal cell carcinoma of skin) 12/13/2010   Left Temple  . Retina disorder, bilateral   . Superficial basal cell carcinoma (BCC) 08/24/2010   Left Temple    History reviewed. No pertinent family history.  Past Surgical History:  Procedure Laterality Date  . ABDOMINAL HYSTERECTOMY  1979  . BACK SURGERY    . CATARACT EXTRACTION  2012  . GALLBLADDER SURGERY  2003  . KNEE SURGERY     Social History   Occupational History  . Not on file   Tobacco Use  . Smoking status: Former Smoker    Quit date: 2013    Years since quitting: 9.2  . Smokeless tobacco: Never Used  Vaping Use  . Vaping Use: Never used  Substance and Sexual Activity  . Alcohol use: Not Currently  . Drug use: Not Currently  .  Sexual activity: Not on file

## 2020-06-24 NOTE — Telephone Encounter (Signed)
Spoke with patient and scheduled visit for today.

## 2020-06-24 NOTE — Telephone Encounter (Signed)
Pt called stating she is having extreme pain in her pain in her left leg it goes from her hip to her foot. Pt states that her foot is numb today.  Pt went to the urgent care 3 days ago and they gave her Meloxicam  and told her if pain didn't subside that she needed to follow up with her Ortho doctor

## 2020-07-08 ENCOUNTER — Encounter: Payer: Self-pay | Admitting: Orthopaedic Surgery

## 2020-07-08 ENCOUNTER — Ambulatory Visit: Payer: Medicare HMO | Admitting: Orthopaedic Surgery

## 2020-07-08 ENCOUNTER — Other Ambulatory Visit: Payer: Self-pay

## 2020-07-08 VITALS — Ht 65.0 in | Wt 168.0 lb

## 2020-07-08 DIAGNOSIS — M79605 Pain in left leg: Secondary | ICD-10-CM

## 2020-07-08 NOTE — Progress Notes (Signed)
Office Visit Note   Patient: Melinda [name]           Date of Birth: October 25, 1950           MRN: [id number] Visit Date: 07/08/2020              Requested by: Harlan Stains, MD [place],  [place] [id number] PCP: Harlan Stains, MD   Assessment & Plan: Visit Diagnoses:  1. Pain in left leg     Plan: [name] was seen several weeks ago for evaluation of left leg pain.  It was difficult to localize her pain.  There were multiple potential etiologies.  I placed her on a Medrol dose pack and she notes that she is significantly better.  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  She believes in retrospect that the problem most likely originates from her back.  She is taking Tylenol as needed and is quite comfortable.  No new neurologic deficits or trouble with bowel or bladder.  She is happy and plan to see her back as needed.  At some point in future we might want to consider an MRI scan of the lumbar spine or repeat the Medrol Dosepak if she has an exacerbation  Follow-Up Instructions: Return if symptoms worsen or fail to improve.   Orders:  No orders of the defined types were placed in this encounter.  No orders of the defined types were placed in this encounter.     Procedures: No procedures performed   Clinical Data: No additional findings.   Subjective: Chief Complaint  Patient presents with  . Left Leg - Follow-up, Pain  Patient presents today for a two week follow up on her left leg. She was given a medrol dosepak at her last visit. She states that she finished the dosepak on 06/30/2020 and is doing much better. She still has some pain in the evenings, but states that it is tolerable. She takes Tylenol as needed.   HPI  Review of Systems   Objective: Vital Signs: Ht 5\' 5"   (1.651 m)   Wt 168 lb (76.2 kg)   LMP 01/01/2015   BMI 27.96 kg/m   Physical Exam Constitutional:      Appearance: She is well-developed.  Eyes:     Pupils: Pupils are equal, round, and reactive to light.  Pulmonary:     Effort: Pulmonary effort is normal.  Skin:    General: Skin is warm and dry.  Neurological:     Mental Status: She is alert and oriented to person, place, and time.  Psychiatric:        Behavior: Behavior normal.     Ortho Exam awake alert and oriented x3 comfortable sitting.  Straight leg raise negative bilaterally.  Known left knee pain.  No knee effusion or instability.  Full extension and flexion over 100 degrees.  No significant tenderness over the peroneal nerve at the fibular head.  Motor exam intact.  No pain with range of motion of right or left hip.  No localized areas of tenderness about her left thigh.  Some areas of tenderness in the lumbar spine. Specialty Comments:  No specialty comments available.  Imaging: No results found.   PMFS History: Patient Active Problem List   Diagnosis Date  Noted  . Pain in left leg 06/24/2020  . Tenosynovitis of left wrist 01/01/2019   Past Medical History:  Diagnosis Date  . 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 Atypical nevus 09/13/2011   Left Upper Arm  . Atypical nevus 08/21/2014   Upper Back - Mild, and Lower Back - Mild  . Atypical nevus 02/10/2017   Upper Left Arm - Mild  . Basal cell carcinoma of skin 11/15/2019   left shoulder-anterior-CX35FU  . BCC (basal cell carcinoma of skin) 12/13/2010   Left Temple  . Retina disorder, bilateral   . Superficial basal cell carcinoma (BCC) 08/24/2010   Left Temple    History reviewed. No pertinent family history.  Past Surgical History:  Procedure Laterality Date  . ABDOMINAL HYSTERECTOMY  1979  . BACK SURGERY    . CATARACT EXTRACTION  2012  . GALLBLADDER SURGERY  2003  . KNEE SURGERY     Social History   Occupational  History  . Not on file  Tobacco Use  . Smoking status: Former Smoker    Quit date: 2013    Years since quitting: 9.2  . Smokeless tobacco: Never Used  Vaping Use  . Vaping Use: Never used  Substance and Sexual Activity  . Alcohol use: Not Currently  . Drug use: Not Currently  . Sexual activity: Not on file

## 2020-07-10 ENCOUNTER — Encounter: Payer: Self-pay | Admitting: Orthopaedic Surgery

## 2020-07-12 ENCOUNTER — Encounter: Payer: Self-pay | Admitting: Orthopaedic Surgery

## 2020-08-12 DIAGNOSIS — H3589 Other specified retinal disorders: Secondary | ICD-10-CM | POA: Diagnosis not present

## 2020-08-12 DIAGNOSIS — G131 Other systemic atrophy primarily affecting central nervous system in neoplastic disease: Secondary | ICD-10-CM | POA: Diagnosis not present

## 2020-08-12 DIAGNOSIS — H43823 Vitreomacular adhesion, bilateral: Secondary | ICD-10-CM | POA: Diagnosis not present

## 2020-10-16 DIAGNOSIS — Z0100 Encounter for examination of eyes and vision without abnormal findings: Secondary | ICD-10-CM | POA: Diagnosis not present

## 2020-10-21 DIAGNOSIS — H5203 Hypermetropia, bilateral: Secondary | ICD-10-CM | POA: Diagnosis not present

## 2020-12-12 DIAGNOSIS — Z1231 Encounter for screening mammogram for malignant neoplasm of breast: Secondary | ICD-10-CM | POA: Diagnosis not present

## 2020-12-16 DIAGNOSIS — E559 Vitamin D deficiency, unspecified: Secondary | ICD-10-CM | POA: Diagnosis not present

## 2020-12-16 DIAGNOSIS — Z23 Encounter for immunization: Secondary | ICD-10-CM | POA: Diagnosis not present

## 2020-12-16 DIAGNOSIS — Z0000 Encounter for general adult medical examination without abnormal findings: Secondary | ICD-10-CM | POA: Diagnosis not present

## 2020-12-16 DIAGNOSIS — E785 Hyperlipidemia, unspecified: Secondary | ICD-10-CM | POA: Diagnosis not present

## 2020-12-22 DIAGNOSIS — N6311 Unspecified lump in the right breast, upper outer quadrant: Secondary | ICD-10-CM | POA: Diagnosis not present

## 2020-12-22 DIAGNOSIS — R928 Other abnormal and inconclusive findings on diagnostic imaging of breast: Secondary | ICD-10-CM | POA: Diagnosis not present

## 2020-12-29 DIAGNOSIS — R928 Other abnormal and inconclusive findings on diagnostic imaging of breast: Secondary | ICD-10-CM | POA: Diagnosis not present

## 2020-12-29 DIAGNOSIS — Z170 Estrogen receptor positive status [ER+]: Secondary | ICD-10-CM | POA: Diagnosis not present

## 2020-12-29 DIAGNOSIS — C50411 Malignant neoplasm of upper-outer quadrant of right female breast: Secondary | ICD-10-CM | POA: Diagnosis not present

## 2021-01-05 DIAGNOSIS — C50411 Malignant neoplasm of upper-outer quadrant of right female breast: Secondary | ICD-10-CM | POA: Diagnosis not present

## 2021-01-05 DIAGNOSIS — Z170 Estrogen receptor positive status [ER+]: Secondary | ICD-10-CM | POA: Diagnosis not present

## 2021-01-06 ENCOUNTER — Encounter: Payer: Self-pay | Admitting: Physician Assistant

## 2021-01-06 ENCOUNTER — Ambulatory Visit: Payer: Medicare HMO | Admitting: Physician Assistant

## 2021-01-06 ENCOUNTER — Other Ambulatory Visit: Payer: Self-pay

## 2021-01-06 DIAGNOSIS — Z85828 Personal history of other malignant neoplasm of skin: Secondary | ICD-10-CM | POA: Diagnosis not present

## 2021-01-06 DIAGNOSIS — Z86018 Personal history of other benign neoplasm: Secondary | ICD-10-CM | POA: Diagnosis not present

## 2021-01-06 DIAGNOSIS — C50919 Malignant neoplasm of unspecified site of unspecified female breast: Secondary | ICD-10-CM

## 2021-01-06 DIAGNOSIS — L570 Actinic keratosis: Secondary | ICD-10-CM | POA: Diagnosis not present

## 2021-01-06 DIAGNOSIS — D485 Neoplasm of uncertain behavior of skin: Secondary | ICD-10-CM

## 2021-01-06 DIAGNOSIS — W57XXXA Bitten or stung by nonvenomous insect and other nonvenomous arthropods, initial encounter: Secondary | ICD-10-CM

## 2021-01-06 DIAGNOSIS — S30861A Insect bite (nonvenomous) of abdominal wall, initial encounter: Secondary | ICD-10-CM | POA: Diagnosis not present

## 2021-01-06 DIAGNOSIS — Z1283 Encounter for screening for malignant neoplasm of skin: Secondary | ICD-10-CM

## 2021-01-06 MED ORDER — BETAMETHASONE DIPROPIONATE AUG 0.05 % EX CREA: TOPICAL_CREAM | Freq: Two times a day (BID) | CUTANEOUS | 3 refills | Status: AC

## 2021-01-06 NOTE — Patient Instructions (Signed)

## 2021-01-06 NOTE — Progress Notes (Signed)
   Follow-Up Visit   Subjective  Melinda [name] is a 70 y.o. female who presents for the following: Annual Exam (Lower back patient had a tag like area but now its pretty much healed. History of atypia and bcc patient was just dx with breast cancer).   The following portions of the chart were reviewed this encounter and updated as appropriate:  Tobacco  Allergies  Meds  Problems  Med Hx  Surg Hx  Fam Hx      Objective  Well appearing patient in no apparent distress; mood and affect are within normal limits.  A full examination was performed including scalp, head, eyes, ears, nose, lips, neck, chest, axillae, abdomen, back, buttocks, bilateral upper extremities, bilateral lower extremities, hands, feet, fingers, toes, fingernails, and toenails. All findings within normal limits unless otherwise noted below.  Right Side of nose Hyperkeratotic scale with pink base      Left Abdomen (side) - Upper Large red nodule  Assessment & Plan  Neoplasm of uncertain behavior of skin Right Side of nose  Skin / nail biopsy Type of biopsy: tangential   Informed consent: discussed and consent obtained   Timeout: patient name, date of birth, surgical site, and procedure verified   Procedure prep:  Patient was prepped and draped in usual sterile fashion (Non sterile) Prep type:  Chlorhexidine Anesthesia: the lesion was anesthetized in a standard fashion   Anesthetic:  1% lidocaine w/ epinephrine 1-100,000 local infiltration Instrument used: flexible razor blade   Outcome: patient tolerated procedure well   Post-procedure details: wound care instructions given    Specimen 1 - Surgical pathology Differential Diagnosis: bcc vs scc  Check Margins: No  Bug bite, initial encounter Left Abdomen (side) - Upper  augmented betamethasone dipropionate (DIPROLENE-AF) 0.05 % cream - Left Abdomen (side) - Upper Apply topically 2 (two) times daily.  Lower back- tag was gone with a residual, small  pink papule.  I, Sherronda Sweigert, PA-C, have reviewed all documentation's for this visit.  The documentation on 01/06/21 for the exam, diagnosis, procedures and orders are all accurate and complete.

## 2021-01-12 DIAGNOSIS — C50411 Malignant neoplasm of upper-outer quadrant of right female breast: Secondary | ICD-10-CM | POA: Diagnosis not present

## 2021-01-12 DIAGNOSIS — Z170 Estrogen receptor positive status [ER+]: Secondary | ICD-10-CM | POA: Diagnosis not present

## 2021-01-12 DIAGNOSIS — Z87891 Personal history of nicotine dependence: Secondary | ICD-10-CM | POA: Diagnosis not present

## 2021-01-12 DIAGNOSIS — Z9049 Acquired absence of other specified parts of digestive tract: Secondary | ICD-10-CM | POA: Diagnosis not present

## 2021-01-12 DIAGNOSIS — Z90710 Acquired absence of both cervix and uterus: Secondary | ICD-10-CM | POA: Diagnosis not present

## 2021-01-12 DIAGNOSIS — C50911 Malignant neoplasm of unspecified site of right female breast: Secondary | ICD-10-CM | POA: Diagnosis not present

## 2021-01-12 DIAGNOSIS — Z86010 Personal history of colonic polyps: Secondary | ICD-10-CM | POA: Diagnosis not present

## 2021-01-14 ENCOUNTER — Telehealth: Payer: Self-pay | Admitting: Physician Assistant

## 2021-01-14 NOTE — Telephone Encounter (Signed)
Results, KRS 

## 2021-01-14 NOTE — Telephone Encounter (Signed)
Patient aware path not reviewed

## 2021-01-18 ENCOUNTER — Telehealth: Payer: Self-pay | Admitting: *Deleted

## 2021-01-18 NOTE — Telephone Encounter (Signed)
Path to patient. Follow up if lesion recurs.

## 2021-01-18 NOTE — Telephone Encounter (Signed)
-----   Message from Warren Danes, [place] sent at 01/18/2021 11:22 AM EDT ----- RTC if recurs

## 2021-01-26 DIAGNOSIS — Z853 Personal history of malignant neoplasm of breast: Secondary | ICD-10-CM | POA: Diagnosis not present

## 2021-01-26 DIAGNOSIS — Z780 Asymptomatic menopausal state: Secondary | ICD-10-CM | POA: Diagnosis not present

## 2021-01-26 DIAGNOSIS — M5126 Other intervertebral disc displacement, lumbar region: Secondary | ICD-10-CM | POA: Diagnosis not present

## 2021-01-26 DIAGNOSIS — M8580 Other specified disorders of bone density and structure, unspecified site: Secondary | ICD-10-CM | POA: Diagnosis not present

## 2021-01-26 DIAGNOSIS — M8588 Other specified disorders of bone density and structure, other site: Secondary | ICD-10-CM | POA: Diagnosis not present

## 2021-02-02 DIAGNOSIS — M8589 Other specified disorders of bone density and structure, multiple sites: Secondary | ICD-10-CM | POA: Diagnosis not present

## 2021-02-02 DIAGNOSIS — Z170 Estrogen receptor positive status [ER+]: Secondary | ICD-10-CM | POA: Diagnosis not present

## 2021-02-02 DIAGNOSIS — C50411 Malignant neoplasm of upper-outer quadrant of right female breast: Secondary | ICD-10-CM | POA: Diagnosis not present

## 2021-03-30 DIAGNOSIS — G131 Other systemic atrophy primarily affecting central nervous system in neoplastic disease: Secondary | ICD-10-CM | POA: Diagnosis not present

## 2021-03-30 DIAGNOSIS — H43823 Vitreomacular adhesion, bilateral: Secondary | ICD-10-CM | POA: Diagnosis not present

## 2021-03-30 DIAGNOSIS — H3589 Other specified retinal disorders: Secondary | ICD-10-CM | POA: Diagnosis not present

## 2021-05-05 DIAGNOSIS — C50919 Malignant neoplasm of unspecified site of unspecified female breast: Secondary | ICD-10-CM | POA: Diagnosis not present

## 2021-05-05 DIAGNOSIS — C50411 Malignant neoplasm of upper-outer quadrant of right female breast: Secondary | ICD-10-CM | POA: Diagnosis not present

## 2021-05-05 DIAGNOSIS — Z803 Family history of malignant neoplasm of breast: Secondary | ICD-10-CM | POA: Diagnosis not present

## 2021-05-05 DIAGNOSIS — Z170 Estrogen receptor positive status [ER+]: Secondary | ICD-10-CM | POA: Diagnosis not present

## 2021-05-05 DIAGNOSIS — Z79811 Long term (current) use of aromatase inhibitors: Secondary | ICD-10-CM | POA: Diagnosis not present

## 2021-05-05 DIAGNOSIS — M8589 Other specified disorders of bone density and structure, multiple sites: Secondary | ICD-10-CM | POA: Diagnosis not present

## 2021-05-05 DIAGNOSIS — Z8042 Family history of malignant neoplasm of prostate: Secondary | ICD-10-CM | POA: Diagnosis not present

## 2021-08-02 DIAGNOSIS — C50411 Malignant neoplasm of upper-outer quadrant of right female breast: Secondary | ICD-10-CM | POA: Diagnosis not present

## 2021-08-02 DIAGNOSIS — Z79811 Long term (current) use of aromatase inhibitors: Secondary | ICD-10-CM | POA: Diagnosis not present

## 2021-08-02 DIAGNOSIS — M8589 Other specified disorders of bone density and structure, multiple sites: Secondary | ICD-10-CM | POA: Diagnosis not present

## 2021-08-02 DIAGNOSIS — Z170 Estrogen receptor positive status [ER+]: Secondary | ICD-10-CM | POA: Diagnosis not present

## 2021-09-29 DIAGNOSIS — G131 Other systemic atrophy primarily affecting central nervous system in neoplastic disease: Secondary | ICD-10-CM | POA: Diagnosis not present

## 2021-09-29 DIAGNOSIS — H3589 Other specified retinal disorders: Secondary | ICD-10-CM | POA: Diagnosis not present

## 2021-09-29 DIAGNOSIS — H43823 Vitreomacular adhesion, bilateral: Secondary | ICD-10-CM | POA: Diagnosis not present

## 2021-11-02 DIAGNOSIS — Z79811 Long term (current) use of aromatase inhibitors: Secondary | ICD-10-CM | POA: Diagnosis not present

## 2021-11-02 DIAGNOSIS — Z170 Estrogen receptor positive status [ER+]: Secondary | ICD-10-CM | POA: Diagnosis not present

## 2021-11-02 DIAGNOSIS — Z9189 Other specified personal risk factors, not elsewhere classified: Secondary | ICD-10-CM | POA: Diagnosis not present

## 2021-11-02 DIAGNOSIS — M8589 Other specified disorders of bone density and structure, multiple sites: Secondary | ICD-10-CM | POA: Diagnosis not present

## 2021-11-02 DIAGNOSIS — T451X5A Adverse effect of antineoplastic and immunosuppressive drugs, initial encounter: Secondary | ICD-10-CM | POA: Diagnosis not present

## 2021-11-02 DIAGNOSIS — R232 Flushing: Secondary | ICD-10-CM | POA: Diagnosis not present

## 2021-11-02 DIAGNOSIS — C50411 Malignant neoplasm of upper-outer quadrant of right female breast: Secondary | ICD-10-CM | POA: Diagnosis not present

## 2021-11-02 DIAGNOSIS — G629 Polyneuropathy, unspecified: Secondary | ICD-10-CM | POA: Diagnosis not present

## 2021-12-14 DIAGNOSIS — Z170 Estrogen receptor positive status [ER+]: Secondary | ICD-10-CM | POA: Diagnosis not present

## 2021-12-14 DIAGNOSIS — C50411 Malignant neoplasm of upper-outer quadrant of right female breast: Secondary | ICD-10-CM | POA: Diagnosis not present

## 2021-12-14 DIAGNOSIS — R922 Inconclusive mammogram: Secondary | ICD-10-CM | POA: Diagnosis not present

## 2021-12-14 DIAGNOSIS — H5203 Hypermetropia, bilateral: Secondary | ICD-10-CM | POA: Diagnosis not present

## 2021-12-29 DIAGNOSIS — G131 Other systemic atrophy primarily affecting central nervous system in neoplastic disease: Secondary | ICD-10-CM | POA: Diagnosis not present

## 2021-12-29 DIAGNOSIS — H43823 Vitreomacular adhesion, bilateral: Secondary | ICD-10-CM | POA: Diagnosis not present

## 2022-01-06 ENCOUNTER — Ambulatory Visit: Payer: Medicare HMO | Admitting: Physician Assistant

## 2022-03-09 DIAGNOSIS — C50411 Malignant neoplasm of upper-outer quadrant of right female breast: Secondary | ICD-10-CM | POA: Diagnosis not present

## 2022-03-09 DIAGNOSIS — Z170 Estrogen receptor positive status [ER+]: Secondary | ICD-10-CM | POA: Diagnosis not present

## 2022-03-09 DIAGNOSIS — R232 Flushing: Secondary | ICD-10-CM | POA: Diagnosis not present

## 2022-03-09 DIAGNOSIS — Z79811 Long term (current) use of aromatase inhibitors: Secondary | ICD-10-CM | POA: Diagnosis not present

## 2022-03-09 DIAGNOSIS — M8589 Other specified disorders of bone density and structure, multiple sites: Secondary | ICD-10-CM | POA: Diagnosis not present

## 2022-03-09 DIAGNOSIS — T451X5A Adverse effect of antineoplastic and immunosuppressive drugs, initial encounter: Secondary | ICD-10-CM | POA: Diagnosis not present

## 2022-03-30 DIAGNOSIS — H3589 Other specified retinal disorders: Secondary | ICD-10-CM | POA: Diagnosis not present

## 2022-03-30 DIAGNOSIS — H43823 Vitreomacular adhesion, bilateral: Secondary | ICD-10-CM | POA: Diagnosis not present

## 2022-04-07 DIAGNOSIS — K649 Unspecified hemorrhoids: Secondary | ICD-10-CM | POA: Diagnosis not present

## 2022-04-07 DIAGNOSIS — Z86010 Personal history of colonic polyps: Secondary | ICD-10-CM | POA: Diagnosis not present

## 2022-04-07 DIAGNOSIS — Z09 Encounter for follow-up examination after completed treatment for conditions other than malignant neoplasm: Secondary | ICD-10-CM | POA: Diagnosis not present

## 2022-06-14 DIAGNOSIS — R92321 Mammographic fibroglandular density, right breast: Secondary | ICD-10-CM | POA: Diagnosis not present

## 2022-06-14 DIAGNOSIS — Z79811 Long term (current) use of aromatase inhibitors: Secondary | ICD-10-CM | POA: Diagnosis not present

## 2022-06-14 DIAGNOSIS — C50411 Malignant neoplasm of upper-outer quadrant of right female breast: Secondary | ICD-10-CM | POA: Diagnosis not present

## 2022-06-14 DIAGNOSIS — R922 Inconclusive mammogram: Secondary | ICD-10-CM | POA: Diagnosis not present

## 2022-06-14 DIAGNOSIS — Z853 Personal history of malignant neoplasm of breast: Secondary | ICD-10-CM | POA: Diagnosis not present

## 2022-06-14 DIAGNOSIS — Z170 Estrogen receptor positive status [ER+]: Secondary | ICD-10-CM | POA: Diagnosis not present

## 2022-07-06 DIAGNOSIS — Z87891 Personal history of nicotine dependence: Secondary | ICD-10-CM | POA: Diagnosis not present

## 2022-07-06 DIAGNOSIS — R232 Flushing: Secondary | ICD-10-CM | POA: Diagnosis not present

## 2022-07-06 DIAGNOSIS — Z170 Estrogen receptor positive status [ER+]: Secondary | ICD-10-CM | POA: Diagnosis not present

## 2022-07-06 DIAGNOSIS — Z79811 Long term (current) use of aromatase inhibitors: Secondary | ICD-10-CM | POA: Diagnosis not present

## 2022-07-06 DIAGNOSIS — T451X5A Adverse effect of antineoplastic and immunosuppressive drugs, initial encounter: Secondary | ICD-10-CM | POA: Diagnosis not present

## 2022-07-06 DIAGNOSIS — M8589 Other specified disorders of bone density and structure, multiple sites: Secondary | ICD-10-CM | POA: Diagnosis not present

## 2022-07-06 DIAGNOSIS — C50411 Malignant neoplasm of upper-outer quadrant of right female breast: Secondary | ICD-10-CM | POA: Diagnosis not present

## 2022-09-26 DIAGNOSIS — C50411 Malignant neoplasm of upper-outer quadrant of right female breast: Secondary | ICD-10-CM | POA: Diagnosis not present

## 2022-09-26 DIAGNOSIS — H3500 Unspecified background retinopathy: Secondary | ICD-10-CM | POA: Diagnosis not present

## 2022-09-26 DIAGNOSIS — Z13220 Encounter for screening for lipoid disorders: Secondary | ICD-10-CM | POA: Diagnosis not present

## 2022-09-26 DIAGNOSIS — Z170 Estrogen receptor positive status [ER+]: Secondary | ICD-10-CM | POA: Diagnosis not present

## 2022-09-26 DIAGNOSIS — Z0000 Encounter for general adult medical examination without abnormal findings: Secondary | ICD-10-CM | POA: Diagnosis not present

## 2022-09-26 DIAGNOSIS — Z79811 Long term (current) use of aromatase inhibitors: Secondary | ICD-10-CM | POA: Diagnosis not present

## 2022-09-26 DIAGNOSIS — M8580 Other specified disorders of bone density and structure, unspecified site: Secondary | ICD-10-CM | POA: Diagnosis not present

## 2022-09-26 DIAGNOSIS — E785 Hyperlipidemia, unspecified: Secondary | ICD-10-CM | POA: Diagnosis not present

## 2022-09-26 DIAGNOSIS — D8989 Other specified disorders involving the immune mechanism, not elsewhere classified: Secondary | ICD-10-CM | POA: Diagnosis not present

## 2022-11-09 DIAGNOSIS — R232 Flushing: Secondary | ICD-10-CM | POA: Diagnosis not present

## 2022-11-09 DIAGNOSIS — Z170 Estrogen receptor positive status [ER+]: Secondary | ICD-10-CM | POA: Diagnosis not present

## 2022-11-09 DIAGNOSIS — M8589 Other specified disorders of bone density and structure, multiple sites: Secondary | ICD-10-CM | POA: Diagnosis not present

## 2022-11-09 DIAGNOSIS — Z79811 Long term (current) use of aromatase inhibitors: Secondary | ICD-10-CM | POA: Diagnosis not present

## 2022-11-09 DIAGNOSIS — T451X5A Adverse effect of antineoplastic and immunosuppressive drugs, initial encounter: Secondary | ICD-10-CM | POA: Diagnosis not present

## 2022-11-09 DIAGNOSIS — C50411 Malignant neoplasm of upper-outer quadrant of right female breast: Secondary | ICD-10-CM | POA: Diagnosis not present

## 2022-11-30 DIAGNOSIS — D225 Melanocytic nevi of trunk: Secondary | ICD-10-CM | POA: Diagnosis not present

## 2022-11-30 DIAGNOSIS — L821 Other seborrheic keratosis: Secondary | ICD-10-CM | POA: Diagnosis not present

## 2022-11-30 DIAGNOSIS — Z1283 Encounter for screening for malignant neoplasm of skin: Secondary | ICD-10-CM | POA: Diagnosis not present

## 2022-12-20 DIAGNOSIS — C50411 Malignant neoplasm of upper-outer quadrant of right female breast: Secondary | ICD-10-CM | POA: Diagnosis not present

## 2022-12-20 DIAGNOSIS — R92323 Mammographic fibroglandular density, bilateral breasts: Secondary | ICD-10-CM | POA: Diagnosis not present

## 2022-12-20 DIAGNOSIS — R928 Other abnormal and inconclusive findings on diagnostic imaging of breast: Secondary | ICD-10-CM | POA: Diagnosis not present

## 2022-12-20 DIAGNOSIS — Z170 Estrogen receptor positive status [ER+]: Secondary | ICD-10-CM | POA: Diagnosis not present

## 2022-12-21 DIAGNOSIS — H5203 Hypermetropia, bilateral: Secondary | ICD-10-CM | POA: Diagnosis not present

## 2022-12-21 DIAGNOSIS — Z0100 Encounter for examination of eyes and vision without abnormal findings: Secondary | ICD-10-CM | POA: Diagnosis not present

## 2023-01-31 DIAGNOSIS — M85852 Other specified disorders of bone density and structure, left thigh: Secondary | ICD-10-CM | POA: Diagnosis not present

## 2023-01-31 DIAGNOSIS — Z170 Estrogen receptor positive status [ER+]: Secondary | ICD-10-CM | POA: Diagnosis not present

## 2023-01-31 DIAGNOSIS — M8589 Other specified disorders of bone density and structure, multiple sites: Secondary | ICD-10-CM | POA: Diagnosis not present

## 2023-01-31 DIAGNOSIS — M8588 Other specified disorders of bone density and structure, other site: Secondary | ICD-10-CM | POA: Diagnosis not present

## 2023-01-31 DIAGNOSIS — C50411 Malignant neoplasm of upper-outer quadrant of right female breast: Secondary | ICD-10-CM | POA: Diagnosis not present

## 2023-04-05 DIAGNOSIS — H3589 Other specified retinal disorders: Secondary | ICD-10-CM | POA: Diagnosis not present

## 2023-04-05 DIAGNOSIS — H43823 Vitreomacular adhesion, bilateral: Secondary | ICD-10-CM | POA: Diagnosis not present

## 2023-04-05 DIAGNOSIS — H04123 Dry eye syndrome of bilateral lacrimal glands: Secondary | ICD-10-CM | POA: Diagnosis not present

## 2023-04-28 DIAGNOSIS — C50411 Malignant neoplasm of upper-outer quadrant of right female breast: Secondary | ICD-10-CM | POA: Diagnosis not present

## 2023-04-28 DIAGNOSIS — Z87891 Personal history of nicotine dependence: Secondary | ICD-10-CM | POA: Diagnosis not present

## 2023-04-28 DIAGNOSIS — Z79899 Other long term (current) drug therapy: Secondary | ICD-10-CM | POA: Diagnosis not present

## 2023-04-28 DIAGNOSIS — Z170 Estrogen receptor positive status [ER+]: Secondary | ICD-10-CM | POA: Diagnosis not present

## 2023-05-05 DIAGNOSIS — M25561 Pain in right knee: Secondary | ICD-10-CM | POA: Diagnosis not present

## 2023-06-30 DIAGNOSIS — M25561 Pain in right knee: Secondary | ICD-10-CM | POA: Diagnosis not present

## 2023-06-30 DIAGNOSIS — G8929 Other chronic pain: Secondary | ICD-10-CM | POA: Diagnosis not present

## 2023-09-27 DIAGNOSIS — Z0000 Encounter for general adult medical examination without abnormal findings: Secondary | ICD-10-CM | POA: Diagnosis not present

## 2023-09-27 DIAGNOSIS — M8580 Other specified disorders of bone density and structure, unspecified site: Secondary | ICD-10-CM | POA: Diagnosis not present

## 2023-09-27 DIAGNOSIS — R202 Paresthesia of skin: Secondary | ICD-10-CM | POA: Diagnosis not present

## 2023-09-27 DIAGNOSIS — E785 Hyperlipidemia, unspecified: Secondary | ICD-10-CM | POA: Diagnosis not present

## 2023-11-02 DIAGNOSIS — C50411 Malignant neoplasm of upper-outer quadrant of right female breast: Secondary | ICD-10-CM | POA: Diagnosis not present

## 2023-11-02 DIAGNOSIS — Z170 Estrogen receptor positive status [ER+]: Secondary | ICD-10-CM | POA: Diagnosis not present

## 2023-11-02 DIAGNOSIS — M8589 Other specified disorders of bone density and structure, multiple sites: Secondary | ICD-10-CM | POA: Diagnosis not present

## 2023-11-02 DIAGNOSIS — Z1231 Encounter for screening mammogram for malignant neoplasm of breast: Secondary | ICD-10-CM | POA: Diagnosis not present

## 2023-11-20 DIAGNOSIS — M5416 Radiculopathy, lumbar region: Secondary | ICD-10-CM | POA: Diagnosis not present
